# Patient Record
Sex: Female | Born: 1990 | Race: White | Hispanic: No | Marital: Single | Smoking: Never smoker
Health system: Southern US, Community
[De-identification: ages and names within clinical notes are randomized; demographics above are authoritative.]

## PROBLEM LIST (undated history)

## (undated) DIAGNOSIS — R000 Tachycardia, unspecified: Secondary | ICD-10-CM

## (undated) DIAGNOSIS — F410 Panic disorder [episodic paroxysmal anxiety] without agoraphobia: Secondary | ICD-10-CM

## (undated) DIAGNOSIS — R42 Dizziness and giddiness: Secondary | ICD-10-CM

## (undated) DIAGNOSIS — M1990 Unspecified osteoarthritis, unspecified site: Secondary | ICD-10-CM

## (undated) DIAGNOSIS — J45909 Unspecified asthma, uncomplicated: Secondary | ICD-10-CM

## (undated) DIAGNOSIS — F419 Anxiety disorder, unspecified: Secondary | ICD-10-CM

## (undated) DIAGNOSIS — M069 Rheumatoid arthritis, unspecified: Secondary | ICD-10-CM

## (undated) DIAGNOSIS — L509 Urticaria, unspecified: Secondary | ICD-10-CM

## (undated) HISTORY — DX: Urticaria, unspecified: L50.9

## (undated) HISTORY — DX: Unspecified asthma, uncomplicated: J45.909

## (undated) HISTORY — DX: Panic disorder (episodic paroxysmal anxiety): F41.0

## (undated) HISTORY — PX: NO PAST SURGERIES: SHX2092

## (undated) HISTORY — DX: Anxiety disorder, unspecified: F41.9

## (undated) HISTORY — DX: Rheumatoid arthritis, unspecified: M06.9

## (undated) HISTORY — DX: Tachycardia, unspecified: R00.0

## (undated) HISTORY — DX: Dizziness and giddiness: R42

## (undated) HISTORY — DX: Unspecified osteoarthritis, unspecified site: M19.90

---

## 2013-07-16 ENCOUNTER — Ambulatory Visit: Payer: BC Managed Care – PPO | Admitting: Physician Assistant

## 2013-07-16 VITALS — BP 120/70 | HR 109 | Temp 99.1°F | Resp 16 | Ht 62.5 in | Wt 184.6 lb

## 2013-07-16 DIAGNOSIS — J029 Acute pharyngitis, unspecified: Secondary | ICD-10-CM

## 2013-07-16 DIAGNOSIS — J4520 Mild intermittent asthma, uncomplicated: Secondary | ICD-10-CM | POA: Insufficient documentation

## 2013-07-16 LAB — POCT CBC
Granulocyte percent: 68.3 %G (ref 37–80)
HEMATOCRIT: 40.1 % (ref 37.7–47.9)
HEMOGLOBIN: 12.6 g/dL (ref 12.2–16.2)
LYMPH, POC: 2 (ref 0.6–3.4)
MCH: 28.4 pg (ref 27–31.2)
MCHC: 31.4 g/dL — AB (ref 31.8–35.4)
MCV: 90.4 fL (ref 80–97)
MID (cbc): 0.7 (ref 0–0.9)
MPV: 9 fL (ref 0–99.8)
POC GRANULOCYTE: 5.9 (ref 2–6.9)
POC LYMPH %: 23.3 % (ref 10–50)
POC MID %: 8.4 %M (ref 0–12)
Platelet Count, POC: 331 10*3/uL (ref 142–424)
RBC: 4.44 M/uL (ref 4.04–5.48)
RDW, POC: 13.3 %
WBC: 8.7 10*3/uL (ref 4.6–10.2)

## 2013-07-16 LAB — POCT RAPID STREP A (OFFICE): RAPID STREP A SCREEN: NEGATIVE
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
## 2013-07-16 NOTE — Patient Instructions (Signed)
Tylenol and motrin for the pain. Mucinex for the congestion and post-nasal drip. Drink fluids to help thin out the mucus.

## 2013-07-19 LAB — CULTURE, GROUP A STREP: Organism ID, Bacteria: NORMAL

## 2013-10-07 ENCOUNTER — Ambulatory Visit (INDEPENDENT_AMBULATORY_CARE_PROVIDER_SITE_OTHER): Payer: BC Managed Care – PPO | Admitting: Physician Assistant

## 2013-10-07 VITALS — BP 112/74 | HR 83 | Temp 97.8°F | Resp 18 | Ht 62.0 in | Wt 182.2 lb

## 2013-10-07 DIAGNOSIS — J45909 Unspecified asthma, uncomplicated: Secondary | ICD-10-CM

## 2013-10-07 MED ORDER — ALBUTEROL SULFATE HFA 108 (90 BASE) MCG/ACT IN AERS: 1.0000 | INHALATION_SPRAY | Freq: Four times a day (QID) | RESPIRATORY_TRACT | Status: DC | PRN

## 2013-10-07 NOTE — Progress Notes (Signed)
   Subjective:    Patient ID: Terri Ross, female    DOB: 1991/01/20, 23 y.o.   MRN: [id number]  HPI 23 year old female presents for refills on her albuterol inhaler.  She uses this intermittently for allergy and exercise induced asthma.  Does not have any daily inhaler that she uses - feels controlled with current therapy.  States she typically uses 1 puff every 2-3 days as needed for SOB.  Does not have episodes of wheezing or chest pain.  Takes OTC allergy medication during spring/fall.      Review of Systems  Respiratory: Negative for cough, shortness of breath and wheezing.   Neurological: Negative for dizziness and headaches.       Objective:   Physical Exam  Constitutional: She is oriented to person, place, and time. She appears well-developed and well-nourished.  HENT:  Head: Normocephalic and atraumatic.  Right Ear: External ear normal.  Left Ear: External ear normal.  Eyes: Conjunctivae are normal.  Neck: Normal range of motion.  Cardiovascular: Normal rate, regular rhythm and normal heart sounds.   Pulmonary/Chest: Effort normal and breath sounds normal.  Neurological: She is alert and oriented to person, place, and time.  Psychiatric: She has a normal mood and affect. Her behavior is normal. Judgment and thought content normal.          Assessment & Plan:  Unspecified asthma(493.90) - Plan: albuterol (PROVENTIL HFA;VENTOLIN HFA) 108 (90 BASE) MCG/ACT inhaler  Refilled albuterol inhaler q4-6hours prn wheezing, SOB, coughing Continue allergy medication as directed RTC precautions discussed. If using albuterol more frequently may need to consider daily inhaler

## 2014-08-15 ENCOUNTER — Ambulatory Visit: Payer: 59

## 2014-08-16 ENCOUNTER — Emergency Department (HOSPITAL_COMMUNITY)
Admission: EM | Admit: 2014-08-16 | Discharge: 2014-08-16 | Disposition: A | Discharge status: Home / Self Care | Payer: 59 | Attending: Emergency Medicine | Admitting: Emergency Medicine

## 2014-08-16 ENCOUNTER — Emergency Department (HOSPITAL_COMMUNITY): Payer: 59

## 2014-08-16 ENCOUNTER — Encounter (HOSPITAL_COMMUNITY): Payer: Self-pay | Admitting: Emergency Medicine

## 2014-08-16 DIAGNOSIS — S299XXA Unspecified injury of thorax, initial encounter: Secondary | ICD-10-CM | POA: Insufficient documentation

## 2014-08-16 DIAGNOSIS — Y9389 Activity, other specified: Secondary | ICD-10-CM | POA: Diagnosis not present

## 2014-08-16 DIAGNOSIS — S3991XA Unspecified injury of abdomen, initial encounter: Secondary | ICD-10-CM | POA: Diagnosis not present

## 2014-08-16 DIAGNOSIS — Z8739 Personal history of other diseases of the musculoskeletal system and connective tissue: Secondary | ICD-10-CM | POA: Insufficient documentation

## 2014-08-16 DIAGNOSIS — Z79899 Other long term (current) drug therapy: Secondary | ICD-10-CM | POA: Insufficient documentation

## 2014-08-16 DIAGNOSIS — S7011XA Contusion of right thigh, initial encounter: Secondary | ICD-10-CM | POA: Diagnosis not present

## 2014-08-16 DIAGNOSIS — S29092A Other injury of muscle and tendon of back wall of thorax, initial encounter: Secondary | ICD-10-CM | POA: Diagnosis not present

## 2014-08-16 DIAGNOSIS — Y998 Other external cause status: Secondary | ICD-10-CM | POA: Insufficient documentation

## 2014-08-16 DIAGNOSIS — Y92410 Unspecified street and highway as the place of occurrence of the external cause: Secondary | ICD-10-CM | POA: Insufficient documentation

## 2014-08-16 DIAGNOSIS — J45909 Unspecified asthma, uncomplicated: Secondary | ICD-10-CM | POA: Diagnosis not present

## 2014-08-16 DIAGNOSIS — S3992XA Unspecified injury of lower back, initial encounter: Secondary | ICD-10-CM | POA: Insufficient documentation

## 2014-08-16 DIAGNOSIS — M542 Cervicalgia: Secondary | ICD-10-CM

## 2014-08-16 DIAGNOSIS — S199XXA Unspecified injury of neck, initial encounter: Secondary | ICD-10-CM | POA: Diagnosis not present

## 2014-08-16 DIAGNOSIS — M545 Low back pain: Secondary | ICD-10-CM

## 2014-08-16 DIAGNOSIS — M791 Myalgia, unspecified site: Secondary | ICD-10-CM

## 2014-08-16 DIAGNOSIS — T148XXA Other injury of unspecified body region, initial encounter: Secondary | ICD-10-CM

## 2014-08-16 DIAGNOSIS — S4991XA Unspecified injury of right shoulder and upper arm, initial encounter: Secondary | ICD-10-CM | POA: Diagnosis not present

## 2014-08-16 LAB — COMPREHENSIVE METABOLIC PANEL
ALBUMIN: 4.6 g/dL (ref 3.5–5.2)
ALT: 24 U/L (ref 0–35)
ANION GAP: 9 (ref 5–15)
AST: 18 U/L (ref 0–37)
Alkaline Phosphatase: 80 U/L (ref 39–117)
BUN: 9 mg/dL (ref 6–23)
CO2: 27 mmol/L (ref 19–32)
CREATININE: 0.73 mg/dL (ref 0.50–1.10)
Calcium: 9.5 mg/dL (ref 8.4–10.5)
Chloride: 102 mmol/L (ref 96–112)
GFR calc non Af Amer: >90 mL/min (ref >90)
Glucose, Bld: 89 mg/dL (ref 70–99)
Potassium: 3.5 mmol/L (ref 3.5–5.1)
SODIUM: 138 mmol/L (ref 135–145)
TOTAL PROTEIN: 8.7 g/dL — AB (ref 6.0–8.3)
Total Bilirubin: 0.5 mg/dL (ref 0.3–1.2)

## 2014-08-16 LAB — CBC WITH DIFFERENTIAL/PLATELET
Basophils Absolute: 0 10*3/uL (ref 0.0–0.1)
Basophils Relative: 0 % (ref 0–1)
Eosinophils Absolute: 0.3 10*3/uL (ref 0.0–0.7)
Eosinophils Relative: 2 % (ref 0–5)
HEMATOCRIT: 46.1 % — AB (ref 36.0–46.0)
Hemoglobin: 15.3 g/dL — ABNORMAL HIGH (ref 12.0–15.0)
Lymphocytes Relative: 20 % (ref 12–46)
Lymphs Abs: 2.7 10*3/uL (ref 0.7–4.0)
MCH: 29.3 pg (ref 26.0–34.0)
MCHC: 33.2 g/dL (ref 30.0–36.0)
MCV: 88.3 fL (ref 78.0–100.0)
MONO ABS: 0.7 10*3/uL (ref 0.1–1.0)
MONOS PCT: 5 % (ref 3–12)
NEUTROS ABS: 9.6 10*3/uL — AB (ref 1.7–7.7)
Neutrophils Relative %: 73 % (ref 43–77)
PLATELETS: 377 10*3/uL (ref 150–400)
RBC: 5.22 MIL/uL — ABNORMAL HIGH (ref 3.87–5.11)
RDW: 12.3 % (ref 11.5–15.5)
WBC: 13.3 10*3/uL — AB (ref 4.0–10.5)

## 2014-08-16 LAB — POC URINE PREG, ED: PREG TEST UR: NEGATIVE

## 2014-08-16 MED ORDER — IOHEXOL 300 MG/ML  SOLN
100.0000 mL | Freq: Once | INTRAMUSCULAR | Status: AC | PRN
  Administered 2014-08-16: 100 mL via INTRAVENOUS

## 2014-08-16 MED ORDER — CYCLOBENZAPRINE HCL 10 MG PO TABS: 5.0000 mg | ORAL_TABLET | Freq: Every day | ORAL | Status: DC
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
## 2014-08-16 NOTE — ED Notes (Addendum)
Pt using profanity on arrival to Pt room for placement of IV for CT scan. PA informed of PT response.

## 2014-08-16 NOTE — Discharge Instructions (Signed)
Please call your doctor for a followup appointment within 24-48 hours. When you talk to your doctor please let them know that you were seen in the emergency department and have them acquire all of your records so that they can discuss the findings with you and formulate a treatment plan to fully care for your new and ongoing problems. Please call and set up an appointment with health and wellness Center to be seen and reassessed Please follow-up with orthopedics Please rest and stay hydrated Please apply warm compressions and massage with icy hot ointment for muscular relief Please take medications as prescribed. While a muscle relaxers his be no drinking alcohol, driving, operating any machinery. Muscle relaxers can cause drowsiness.  Please continue to monitor symptoms closely and if symptoms are to worsen or change (fever greater than 101, chills, sweating, nausea, vomiting, chest pain, shortness of breathe, difficulty breathing, weakness, numbness, tingling, worsening or changes to pain pattern, fall, injury, headache, dizziness, loss of sensation, visual changes, neck pain, neck stiffness, it is swallow, inability to food or fluids down, inability to control urine or bowel movements) please report back to the Emergency Department immediately.    Abrasion An abrasion is a cut or scrape of the skin. Abrasions do not extend through all layers of the skin and most heal within 10 days. It is important to care for your abrasion properly to prevent infection. CAUSES  Most abrasions are caused by falling on, or gliding across, the ground or other surface. When your skin rubs on something, the outer and inner layer of skin rubs off, causing an abrasion. DIAGNOSIS  Your caregiver will be able to diagnose an abrasion during a physical exam.  TREATMENT  Your treatment depends on how large and deep the abrasion is. Generally, your abrasion will be cleaned with water and a mild soap to remove any dirt or  debris. An antibiotic ointment may be put over the abrasion to prevent an infection. A bandage (dressing) may be wrapped around the abrasion to keep it from getting dirty.  You may need a tetanus shot if:  You cannot remember when you had your last tetanus shot.  You have never had a tetanus shot.  The injury broke your skin. If you get a tetanus shot, your arm may swell, get red, and feel warm to the touch. This is common and not a problem. If you need a tetanus shot and you choose not to have one, there is a rare chance of getting tetanus. Sickness from tetanus can be serious.  HOME CARE INSTRUCTIONS   If a dressing was applied, change it at least once a day or as directed by your caregiver. If the bandage sticks, soak it off with warm water.   Wash the area with water and a mild soap to remove all the ointment 2 times a day. Rinse off the soap and pat the area dry with a clean towel.   Reapply any ointment as directed by your caregiver. This will help prevent infection and keep the bandage from sticking. Use gauze over the wound and under the dressing to help keep the bandage from sticking.   Change your dressing right away if it becomes wet or dirty.   Only take over-the-counter or prescription medicines for pain, discomfort, or fever as directed by your caregiver.   Follow up with your caregiver within 24-48 hours for a wound check, or as directed. If you were not given a wound-check appointment, look closely at your  abrasion for redness, swelling, or pus. These are signs of infection. SEEK IMMEDIATE MEDICAL CARE IF:   You have increasing pain in the wound.   You have redness, swelling, or tenderness around the wound.   You have pus coming from the wound.   You have a fever or persistent symptoms for more than 2-3 days.  You have a fever and your symptoms suddenly get worse.  You have a bad smell coming from the wound or dressing.  MAKE SURE YOU:   Understand these  instructions.  Will watch your condition.  Will get help right away if you are not doing well or get worse. Document Released: 02/01/2005 Document Revised: 04/10/2012 Document Reviewed: 03/28/2011 [facility] Patient Information 2015 [place], [place]. This information is not intended to replace advice given to you by your health care provider. Make sure you discuss any questions you have with your health care provider. Muscle Pain Muscle pain (myalgia) may be caused by many things, including:  Overuse or muscle strain, especially if you are not in shape. This is the most common cause of muscle pain.  Injury.  Bruises.  Viruses, such as the flu.  Infectious diseases.  Fibromyalgia, which is a chronic condition that causes muscle tenderness, fatigue, and headache.  Autoimmune diseases, including lupus.  Certain drugs, including ACE inhibitors and statins. Muscle pain may be mild or severe. In most cases, the pain lasts only a short time and goes away without treatment. To diagnose the cause of your muscle pain, your health care provider will take your medical history. This means he or she will ask you when your muscle pain began and what has been happening. If you have not had muscle pain for very long, your health care provider may want to wait before doing much testing. If your muscle pain has lasted a long time, your health care provider may want to run tests right away. If your health care provider thinks your muscle pain may be caused by illness, you may need to have additional tests to rule out certain conditions.  Treatment for muscle pain depends on the cause. Home care is often enough to relieve muscle pain. Your health care provider may also prescribe anti-inflammatory medicine. HOME CARE INSTRUCTIONS Watch your condition for any changes. The following actions may help to lessen any discomfort you are feeling:  Only take over-the-counter or prescription medicines as directed by your  health care provider.  Apply ice to the sore muscle:  Put ice in a plastic bag.  Place a towel between your skin and the bag.  Leave the ice on for 15-20 minutes, 3-4 times a day.  You may alternate applying hot and cold packs to the muscle as directed by your health care provider.  If overuse is causing your muscle pain, slow down your activities until the pain goes away.  Remember that it is normal to feel some muscle pain after starting a workout program. Muscles that have not been used often will be sore at first.  Do regular, gentle exercises if you are not usually active.  Warm up before exercising to lower your risk of muscle pain.  Do not continue working out if the pain is very bad. Bad pain could mean you have injured a muscle. SEEK MEDICAL CARE IF:  Your muscle pain gets worse, and medicines do not help.  You have muscle pain that lasts longer than 3 days.  You have a rash or fever along with muscle pain.  You  have muscle pain after a tick bite.  You have muscle pain while working out, even though you are in good physical condition.  You have redness, soreness, or swelling along with muscle pain.  You have muscle pain after starting a new medicine or changing the dose of a medicine. SEEK IMMEDIATE MEDICAL CARE IF:  You have trouble breathing.  You have trouble swallowing.  You have muscle pain along with a stiff neck, fever, and vomiting.  You have severe muscle weakness or cannot move part of your body. MAKE SURE YOU:   Understand these instructions.  Will watch your condition.  Will get help right away if you are not doing well or get worse. Document Released: 03/16/2006 Document Revised: 04/29/2013 Document Reviewed: 02/18/2013 [facility] Patient Information 2015 [place], [place]. This information is not intended to replace advice given to you by your health care provider. Make sure you discuss any questions you have with your health care  provider.   Emergency Department Resource Guide 1) Find a Doctor and Pay Out of Pocket Although you won't have to find out who is covered by your insurance plan, it is a good idea to ask around and get recommendations. You will then need to call the office and see if the doctor you have chosen will accept you as a new patient and what types of options they offer for patients who are self-pay. Some doctors offer discounts or will set up payment plans for their patients who do not have insurance, but you will need to ask so you aren't surprised when you get to your appointment.  2) Contact Your Local Health Department Not all health departments have doctors that can see patients for sick visits, but many do, so it is worth a call to see if yours does. If you don't know where your local health department is, you can check in your phone book. The CDC also has a tool to help you locate your state's health department, and many state websites also have listings of all of their local health departments.  3) Find a Walk-in Clinic If your illness is not likely to be very severe or complicated, you may want to try a walk in clinic. These are popping up all over the country in pharmacies, drugstores, and shopping centers. They're usually staffed by nurse practitioners or physician assistants that have been trained to treat common illnesses and complaints. They're usually fairly quick and inexpensive. However, if you have serious medical issues or chronic medical problems, these are probably not your best option.  No Primary Care Doctor: - Call Health Connect at  [phone] - they can help you locate a primary care doctor that  accepts your insurance, provides certain services, etc. - Physician Referral Service- [phone]  Chronic Pain Problems: Organization         Address  Phone   Notes  [facility]  [phone] Patients need to be referred by their primary care doctor.    Medication Assistance: Organization         Address  Phone   Notes  [facility] Medication [facility] [address] [place] [place], [place] [address] [phone] --Must be a resident of [facility] -- Must have NO insurance coverage whatsoever (no Medicaid/ Medicare, etc.) -- The pt. MUST have a primary care doctor that directs their care regularly and follows them in the community   [facility]  [phone]   [address]  [phone]  Agencies that provide inexpensive medical care: Organization         Address  Phone   Notes  [facility]  [phone]   [facility]    [phone]   [facility] [address] [place], [place] [address] [phone]   [facility] [address], [place] [phone]   Planned Parenthood    [phone]   [facility]    [phone]   [facility] and [facility]  [address], [place] Phone:  [phone], Fax:  [phone] Hours of Operation:  9 am - 6 pm, M-F.  Also accepts Medicaid/Medicare and self-pay.  [facility]  [address], [place] Phone: [phone], Fax: [phone]. Hours of Operation:  8:30 am - 5:30 pm, M-F.  Also accepts Medicaid and self-pay.  [facility] [place] [address] [place] Phone: [phone]   [facility] [address] Natasha Bence [place], [place] [phone], Ext. [phone] Mondays & Thursdays: 7-9 AM.  First 15 patients are seen on a first come, first serve basis.    Medicaid-accepting [facility] Providers:  Organization         Address  Phone   Notes  [facility] [address], [place] [phone] Also accepts self-pay patients.  [facility] [address] Laurell Josephs [place], [place]  [phone]   [facility] [address], [place] [phone]   [facility] [address] [place] [phone]   Renaye Rakers [address], [place]   [phone] Only accepts [place] Access [place] patients after they have their name applied to their card.   Self-Pay (no insurance) in [facility]:  Organization         Address  Phone   Notes  Sickle Cell Patients, [facility] [address] [place], [place] [phone]   [facility] [address] [place], [place] [phone]   [facility] [place]  [address], [place] [phone]   [facility]/Dr. Osei-Bonsu  [address] [place] or [address], [place] [phone] Phone number for both [place] and [place] locations is the same.  [facility] [address] [place] [phone]   [facility] [address] [place] or [address] [phone]   [facility] [address] [place] [phone], phone; [phone], fax Sees patients 1st and 3rd Saturday of every month.  Must not qualify for public or private insurance (i.e. Medicaid, Medicare, [place] Health Choice, Veterans' Benefits)  Household income should be no more than 200% of the poverty level The clinic cannot treat you if you are pregnant or think you are pregnant  Sexually transmitted diseases are not treated at the clinic.    Dental Care: Organization         Address  Phone  Notes  [facility] Department of [facility] [address] [place], [place] [phone] Accepts children up to age 84 who are enrolled in [place] or [place] Health Choice; pregnant women with a Medicaid card; and children who have applied for Medicaid or [place]  Health Choice, but were declined, whose parents can pay a reduced fee at time of service.   [facility] Department of [facility]  [address], [place] [phone] Accepts children up to age 64 who are enrolled in [place] or [place] Health Choice; pregnant women with a Medicaid card; and children who have applied for Medicaid or [place] Health Choice, but were declined, whose parents can pay a reduced fee at time of service.  [facility]  [address] [place], [place] [phone] Patients are seen by appointment only. Walk-ins are not accepted. [facility] will see patients 62 years of age and older. Monday - Tuesday (8am-5pm) Most Wednesdays (8:30-5pm) $30 per visit, cash only  [facility] Adult Dental Access PRO[facility]  [address], [facility] [phone] Patients are seen by appointment only. Walk-ins are not accepted. [facility] will see patients 63 years of age and older. One Wednesday Evening (Monthly: Volunteer Based).  $30 per visit, cash only  [employer]  [phone] for adults; Children under age 69, call Graduate Pediatric Dentistry at [phone]. Children aged 64-14, please call [phone] to request a pediatric application.  Dental services are provided in all areas of dental care including fillings, crowns and bridges, complete and partial dentures, implants, gum treatment, root canals, and extractions. Preventive care is also provided. Treatment is provided to both adults and children. Patients are selected via a lottery and there is often a waiting list.   [facility] [address], [place]  [phone] [email or web address]   [facility] [address] [place], [place] [phone], Ext. [phone] Second and Fourth Thursday of each month, opens at 6:30 AM; Clinic ends at 9 AM.  Patients are seen on a first-come first-served basis, and a limited number are seen during each clinic.   [facility]  [address] Ether Griffins [place], [place] [phone]   Eligibility Requirements You must have lived in [place], [place], or [place] counties for at least the last three months.   You cannot be eligible for state or federal sponsored [place], including [employer], [place], or [employer].   You generally cannot be eligible for healthcare insurance through your employer.    How to apply: Eligibility screenings are held every Tuesday and Wednesday afternoon from 1:00 pm until 4:00 pm. You do not need an appointment for the interview!  [facility] [address], [place], [place] [phone]   [facility] Health Department  [phone]   [facility] Health Department  [phone]   [facility] Health Department  [phone]    Behavioral Health Resources in the Community: Intensive Outpatient Programs Organization         Address  Phone  Notes  [facility] [address] [place], [place] [phone]   [facility] Outpatient [address], [place], [place] [phone]   [facility] [address], [place], [place]  [phone]   [facility] Mental Health [address],  [place], [place] [phone] or [phone]   Substance Abuse Resources Organization         Address  Phone  Notes  [facility] Drug [facility]  [phone]   [facility]  [phone]   [facility] [place]  [phone]   Floydene Flock  [phone]   Residential & Outpatient Substance Abuse Program  [phone]  Psychological Services Organization         Address  Phone  Notes  [facility]al Health  [phone]   [facility] Services  [phone]   [facility] Mental Health [phone] [address], [place] [phone] or [phone]    Mobile Crisis Teams Organization         Address  Phone  Notes  [facility], Mobile Crisis Care Unit  [phone]   [facility]  [address] [place], [place] [phone]   Doristine Locks [address] [place] [phone]    Self-Help/Support Groups Organization         Address  Phone             Notes  [facility] [place] - variety of support groups  336- I7437963 Call for more information  Narcotics Anonymous (NA), [facility] [address], [employer] [place]  2 meetings at this location   Statistician         Address  Phone  Notes  [facility] [address],    [place]  [phone]   [facility]  [address], [place] [address], [place], [place] [phone]   [facility] Treatment Facility [address] [place], [place] [phone] Admissions: 8am-3pm M-F  [facility] [address],    [place], [place] [phone]   [facility] [address] [place], [place], [place] [phone]   [facility] [address],  [place], [place] [phone]   [facility] - Intensive Outpatient [address] Laurell Josephs [address], [place], [place] [phone]   [facility] ([facility] [address] [place]  [place], [place] [phone] or [phone]   [facility] ([facility]) [address] [place], [place] [phone] Accepts Medicaid  [facility] [address]  [place] [phone] Substance Abuse/Addiction Treatment   [facility] Organization         Address  Phone  Notes  [facility]  [phone]   Angie Fava, PhD [address] Ervin Knack [place], [place]   [phone] or [phone]   [facility] Behavioral   [address] [place], [place] [phone]   [facility] [address] [place], [place] [phone] Insurance/Medicaid/sponsorship through [facility] [address]                                    [place], [place] [phone]  Therapy/tele-psych/case  [facility] [address], [place] [phone]    Dr. Lolly Mustache  [phone]   [facility]  [employer] [facility] Dept. 1) [address], [place] 2) [address], [place] 3)  [address], [place] [phone]  [phone]   [facility] Child Abuse Hotline (772)169-9250 or [phone] (After Hours)

## 2014-08-16 NOTE — ED Notes (Signed)
PA at PT bed  To explain the need for IV placement.

## 2014-09-07 ENCOUNTER — Other Ambulatory Visit: Payer: Self-pay | Admitting: *Deleted

## 2014-09-07 DIAGNOSIS — J45909 Unspecified asthma, uncomplicated: Secondary | ICD-10-CM

## 2014-09-07 MED ORDER — ALBUTEROL SULFATE HFA 108 (90 BASE) MCG/ACT IN AERS: 1.0000 | INHALATION_SPRAY | Freq: Four times a day (QID) | RESPIRATORY_TRACT | Status: DC | PRN

## 2015-08-18 DIAGNOSIS — F3113 Bipolar disorder, current episode manic without psychotic features, severe: Secondary | ICD-10-CM | POA: Diagnosis not present

## 2015-08-20 DIAGNOSIS — F411 Generalized anxiety disorder: Secondary | ICD-10-CM | POA: Diagnosis not present

## 2015-09-08 DIAGNOSIS — F3112 Bipolar disorder, current episode manic without psychotic features, moderate: Secondary | ICD-10-CM | POA: Diagnosis not present

## 2015-09-10 DIAGNOSIS — F411 Generalized anxiety disorder: Secondary | ICD-10-CM | POA: Diagnosis not present

## 2015-09-10 DIAGNOSIS — F329 Major depressive disorder, single episode, unspecified: Secondary | ICD-10-CM | POA: Diagnosis not present

## 2015-09-10 DIAGNOSIS — F331 Major depressive disorder, recurrent, moderate: Secondary | ICD-10-CM | POA: Diagnosis not present

## 2016-02-04 ENCOUNTER — Ambulatory Visit (INDEPENDENT_AMBULATORY_CARE_PROVIDER_SITE_OTHER): Payer: BLUE CROSS/BLUE SHIELD | Admitting: Physician Assistant

## 2016-02-04 VITALS — BP 118/72 | HR 88 | Temp 98.5°F | Resp 17 | Ht 61.5 in | Wt 181.4 lb

## 2016-02-04 DIAGNOSIS — F411 Generalized anxiety disorder: Secondary | ICD-10-CM | POA: Insufficient documentation

## 2016-02-04 DIAGNOSIS — F32A Depression, unspecified: Secondary | ICD-10-CM | POA: Insufficient documentation

## 2016-02-04 DIAGNOSIS — H66001 Acute suppurative otitis media without spontaneous rupture of ear drum, right ear: Secondary | ICD-10-CM

## 2016-02-04 DIAGNOSIS — F329 Major depressive disorder, single episode, unspecified: Secondary | ICD-10-CM | POA: Insufficient documentation

## 2016-02-04 MED ORDER — AMOXICILLIN 875 MG PO TABS: 875.0000 mg | ORAL_TABLET | Freq: Two times a day (BID) | ORAL | 0 refills | Status: DC

## 2016-02-04 NOTE — Progress Notes (Signed)
   Terri Ross  MRN: [id number] DOB: 08-22-1990  Subjective:  Pt presents to clinic with cold symptoms for the last 2 days.  It started with pain in the right ear.  She then started to develop sinus pressures with nasal rhinorrhea with yellow/green color than is mainly causing a PND.  She has horse voice.  She has had some dizziness with head/sinus pressure.  No F/C.  Slight cough 2nd to PND.  She has tried mucinex for a day but did not feel like it helped much.    Review of Systems  Constitutional: Negative for chills.  HENT: Positive for ear pain (right), postnasal drip and rhinorrhea. Negative for sore throat (mild in the am).   Respiratory: Negative for cough, shortness of breath and wheezing.        H/o asthma - she has had to use her inhaler in the middle of the night 2nd to cough 2nd to PND - uses her inhaler and than able to go back to sleep  Neurological: Positive for light-headedness. Negative for dizziness and headaches.    Patient Active Problem List   Diagnosis Date Noted  . Depression 02/04/2016  . GAD (generalized anxiety disorder) 02/04/2016  . Mild intermittent asthma 07/16/2013    Current Outpatient Prescriptions on File Prior to Visit  Medication Sig Dispense Refill  . albuterol (PROVENTIL HFA;VENTOLIN HFA) 108 (90 BASE) MCG/ACT inhaler Inhale 1-2 puffs into the lungs every 6 (six) hours as needed for wheezing or shortness of breath. No more refills without office visit 6.7 g 0   No current facility-administered medications on file prior to visit.     Allergies  Allergen Reactions  . Lamotrigine Rash    Pt patients past, family and social history were reviewed and updated.  Objective:  BP 118/72 (BP Location: Right Arm, Patient Position: Sitting, Cuff Size: Normal)   Pulse 88   Temp 98.5 F (36.9 C) (Oral)   Resp 17   Ht 5' 1.5" (1.562 m)   Wt 181 lb 6.4 oz (82.3 kg)   SpO2 96%   BMI 33.72 kg/m   Physical Exam  Constitutional: She is oriented to  person, place, and time and well-developed, well-nourished, and in no distress.  HENT:  Head: Normocephalic and atraumatic.  Right Ear: Hearing, external ear and ear canal normal. Tympanic membrane is bulging. Tympanic membrane is not injected and not erythematous. A middle ear effusion (purulence fluid) is present.  Left Ear: Hearing, tympanic membrane, external ear and ear canal normal. Tympanic membrane is not injected, not erythematous and not bulging.  Nose: Nose normal.  Mouth/Throat: Uvula is midline, oropharynx is clear and moist and mucous membranes are normal.  Eyes: Conjunctivae are normal.  Neck: Normal range of motion.  Cardiovascular: Normal rate, regular rhythm and normal heart sounds.   No murmur heard. Pulmonary/Chest: Effort normal and breath sounds normal.  Neurological: She is alert and oriented to person, place, and time. Gait normal.  Skin: Skin is warm and dry.  Psychiatric: Mood, memory, affect and judgment normal.  Vitals reviewed.   Assessment and Plan :  Acute suppurative otitis media of right ear without spontaneous rupture of tympanic membrane, recurrence not specified - Plan: amoxicillin (AMOXIL) 875 MG tablet   Symptomatic treatment d/w patient  Benny Lennert PA-C  [facility] 02/04/2016 11:02 AM

## 2016-02-04 NOTE — Patient Instructions (Addendum)
Otitis Media, Adult Otitis media is redness, soreness, and inflammation of the middle ear. Otitis media may be caused by allergies or, most commonly, by infection. Often it occurs as a complication of the common cold. SIGNS AND SYMPTOMS Symptoms of otitis media may include:  Earache.  Fever.  Ringing in your ear.  Headache.  Leakage of fluid from the ear. DIAGNOSIS To diagnose otitis media, your health care provider will examine your ear with an otoscope. This is an instrument that allows your health care provider to see into your ear in order to examine your eardrum. Your health care provider also will ask you questions about your symptoms. TREATMENT  Typically, otitis media resolves on its own within 3-5 days. Your health care provider may prescribe medicine to ease your symptoms of pain. If otitis media does not resolve within 5 days or is recurrent, your health care provider may prescribe antibiotic medicines if he or she suspects that a bacterial infection is the cause. HOME CARE INSTRUCTIONS   If you were prescribed an antibiotic medicine, finish it all even if you start to feel better.  Take medicines only as directed by your health care provider.  Keep all follow-up visits as directed by your health care provider. SEEK MEDICAL CARE IF:  You have otitis media only in one ear, or bleeding from your nose, or both.  You notice a lump on your neck.  You are not getting better in 3-5 days.  You feel worse instead of better. SEEK IMMEDIATE MEDICAL CARE IF:   You have pain that is not controlled with medicine.  You have swelling, redness, or pain around your ear or stiffness in your neck.  You notice that part of your face is paralyzed.  You notice that the bone behind your ear (mastoid) is tender when you touch it. MAKE SURE YOU:   Understand these instructions.  Will watch your condition.  Will get help right away if you are not doing well or get worse.   This  information is not intended to replace advice given to you by your health care provider. Make sure you discuss any questions you have with your health care provider.   Document Released: 01/28/2004 Document Revised: 05/15/2014 Document Reviewed: 11/19/2012 Elsevier Interactive Patient Education [employer]     IF you received an x-ray today, you will receive an invoice from [facility]. Please contact [facility] at [phone] with questions or concerns regarding your invoice.   IF you received labwork today, you will receive an invoice from [employer]. Please contact [facility] at [phone] with questions or concerns regarding your invoice.   Our billing staff will not be able to assist you with questions regarding bills from these companies.  You will be contacted with the lab results as soon as they are available. The fastest way to get your results is to activate your My Chart account. Instructions are located on the last page of this paperwork. If you have not heard from [place] regarding the results in 2 weeks, please contact this office.

## 2016-04-05 ENCOUNTER — Ambulatory Visit (INDEPENDENT_AMBULATORY_CARE_PROVIDER_SITE_OTHER): Payer: BLUE CROSS/BLUE SHIELD | Admitting: Emergency Medicine

## 2016-04-05 VITALS — BP 130/82 | HR 102 | Temp 97.7°F

## 2016-04-05 DIAGNOSIS — J019 Acute sinusitis, unspecified: Secondary | ICD-10-CM | POA: Diagnosis not present

## 2016-04-05 DIAGNOSIS — J4531 Mild persistent asthma with (acute) exacerbation: Secondary | ICD-10-CM | POA: Diagnosis not present

## 2016-04-05 MED ORDER — PREDNISONE 20 MG PO TABS: 40.0000 mg | ORAL_TABLET | Freq: Every day | ORAL | 0 refills | Status: DC

## 2016-04-05 MED ORDER — AMOXICILLIN 875 MG PO TABS: 875.0000 mg | ORAL_TABLET | Freq: Two times a day (BID) | ORAL | 0 refills | Status: DC

## 2016-04-05 MED ORDER — ALBUTEROL SULFATE HFA 108 (90 BASE) MCG/ACT IN AERS: 2.0000 | INHALATION_SPRAY | Freq: Four times a day (QID) | RESPIRATORY_TRACT | 0 refills | Status: DC | PRN

## 2016-04-05 NOTE — Progress Notes (Signed)
Subjective: Terri Ross, wheeze KKX:FGHWE Key is a 25 y.o. female presenting to clinic today for same day appointment. PCP: No PCP Per Patient Concerns today include:  URI Patient reports that she started having sinus congestion with intermittent headache.  Endorses post nasal drip.  Reports occ dry cough.  Endorses sneezing and sore throat.  Denies fevers, nausea, vomiting, diarrhea.  Symptoms started on 11/19.  She reports that symptoms wax and wane.  She notes that things seemed to get better then got worse again.  She notes she rides a scooter for transportation and is often exposed to the elements.  Roommate had a sinus infection a few weeks ago.  Patient has a history of asthma.  She reports intermittent rattling in her chest.  She notes that she is having SOB about every 4 hours w/ wheeze.  She notes that she used her Albuterol around the clock on 11/19.  Over the last few days she has been using 1 puff QID.  Not on a controller medication.  Uses Chlorphen once daily for allergy control.  She has not used any other OTC meds for her symptoms.  She last used her Albuterol inhaler about 1 hour ago.  Social History Reviewed: non smoker. FamHx and MedHx reviewed.  Please see EMR. Health Maintenance: did not get flu shot and does not desire one.  ROS: Per HPI  Objective: Office vital signs reviewed. BP 130/82 (BP Location: Right Arm, Patient Position: Sitting, Cuff Size: Small)   Pulse (!) 102   Temp 97.7 F (36.5 C) (Oral)   LMP 02/23/2016   SpO2 98%   Physical Examination:  General: Awake, alert, well nourished, No acute distress HEENT: Normal    Neck: No masses palpated. No lymphadenopathy    Ears: Tympanic membranes intact, normal light reflex, no erythema, no bulging    Eyes: PERRLA, EOMI    Nose: nasal turbinates moist, no purulence    Throat: moist mucus membranes, no erythema, no tonsillar exudate or erythema Cardio: regular rate and rhythm, S1S2 heard, no murmurs  appreciated Pulm: clear to auscultation bilaterally, no wheezes, rhonchi or rales, moving good air throughout, normal WOB on room air Extremities: warm, well perfused, No edema, cyanosis or clubbing; +2 pulses bilaterally  Assessment/ Plan: 25 y.o. female   1. Acute non-recurrent sinusitis, unspecified location.  No purulence on exam and no sinus tenderness but given "second sickness", I am concerned about a sinus infection.   - amoxicillin (AMOXIL) 875 MG tablet; Take 1 tablet (875 mg total) by mouth 2 (two) times daily.  Dispense: 20 tablet; Refill: 0 - Return precautions reviewed.  2. Mild persistent asthma with exacerbation.  Normal WOB on room air, moving air well.  Normal O2 saturation.  I suspect the tachycardia is secondary to recent albuterol use.  She appears well hydrated and is afebrile on exam.  I also discussed with patient that she should establish/ see her PCP for an asthma controller medication, as she noted using Albuterol almost daily.  For now, will treat exacerbation. - albuterol (PROVENTIL HFA;VENTOLIN HFA) 108 (90 Base) MCG/ACT inhaler; Inhale 2 puffs into the lungs every 6 (six) hours as needed for wheezing or shortness of breath.  Dispense: 6.7 g; Refill: 0 - predniSONE (DELTASONE) 20 MG tablet; Take 2 tablets (40 mg total) by mouth daily with breakfast.  Dispense: 10 tablet; Refill: 0 - Strict return precautions reviewed  Follow up prn. This patient was discussed with Daub, who agrees with my assessment and plan.  Janora Norlander, DO PGY-3, [facility]

## 2016-04-05 NOTE — Patient Instructions (Addendum)
I have sent in an antibiotic for you to take twice daily for the next 10 days, a steroid to help your breathing to take once daily for 5 days and a refill on your Albuterol inhaler.  I recommend that you use the albuterol inhaler every 4-6 hours while awake for the next 48 hours then only as needed.  I recommend that you follow up with your Primary care doctor in the next week or so to discuss a controller medication for your Asthma, as Albuterol is not recommended for daily use.    Sinusitis, Adult Sinusitis is soreness and inflammation of your sinuses. Sinuses are hollow spaces in the bones around your face. They are located:  Around your eyes.  In the middle of your forehead.  Behind your nose.  In your cheekbones. Your sinuses and nasal passages are lined with a stringy fluid (mucus). Mucus normally drains out of your sinuses. When your nasal tissues get inflamed or swollen, the mucus can get trapped or blocked so air cannot flow through your sinuses. This lets bacteria, viruses, and funguses grow, and that leads to infection. Follow these instructions at home: Medicines  Take, use, or apply over-the-counter and prescription medicines only as told by your doctor. These may include nasal sprays.  If you were prescribed an antibiotic medicine, take it as told by your doctor. Do not stop taking the antibiotic even if you start to feel better. Hydrate and Humidify  Drink enough water to keep your pee (urine) clear or pale yellow.  Use a cool mist humidifier to keep the humidity level in your home above 50%.  Breathe in steam for 10-15 minutes, 3-4 times a day or as told by your doctor. You can do this in the bathroom while a hot shower is running.  Try not to spend time in cool or dry air. Rest  Rest as much as possible.  Sleep with your head raised (elevated).  Make sure to get enough sleep each night. General instructions  Put a warm, moist washcloth on your face 3-4 times a  day or as told by your doctor. This will help with discomfort.  Wash your hands often with soap and water. If there is no soap and water, use hand sanitizer.  Do not smoke. Avoid being around people who are smoking (secondhand smoke).  Keep all follow-up visits as told by your doctor. This is important. Contact a doctor if:  You have a fever.  Your symptoms get worse.  Your symptoms do not get better within 10 days. Get help right away if:  You have a very bad headache.  You cannot stop throwing up (vomiting).  You have pain or swelling around your face or eyes.  You have trouble seeing.  You feel confused.  Your neck is stiff.  You have trouble breathing. This information is not intended to replace advice given to you by your health care provider. Make sure you discuss any questions you have with your health care provider. Document Released: 10/11/2007 Document Revised: 12/19/2015 Document Reviewed: 02/17/2015 Elsevier Interactive Patient Education  2017 Elsevier Inc.   Bronchospasm, Adult A bronchospasm is when the tubes that carry air in and out of your lungs (airways) spasm or tighten. During a bronchospasm it is hard to breathe. This is because the airways get smaller. A bronchospasm can be triggered by:  Allergies. These may be to animals, pollen, food, or mold.  Infection. This is a common cause of bronchospasm.  Exercise.  Irritants. These include pollution, cigarette smoke, strong odors, aerosol sprays, and paint fumes.  Weather changes.  Stress.  Being emotional. Follow these instructions at home:  Always have a plan for getting help. Know when to call your doctor and local emergency services (911 in the [place] Know where you can get emergency care.  Only take medicines as told by your doctor.  If you were prescribed an inhaler or nebulizer machine, ask your doctor how to use it correctly. Always use a spacer with your inhaler if you were given  one.  Stay calm during an attack. Try to relax and breathe more slowly.  Control your home environment:  Change your heating and air conditioning filter at least once a month.  Limit your use of fireplaces and wood stoves.  Do not  smoke. Do not  allow smoking in your home.  Avoid perfumes and fragrances.  Get rid of pests (such as roaches and mice) and their droppings.  Throw away plants if you see mold on them.  Keep your house clean and dust free.  Replace carpet with wood, tile, or vinyl flooring. Carpet can trap dander and dust.  Use allergy-proof pillows, mattress covers, and box spring covers.  Wash bed sheets and blankets every week in hot water. Dry them in a dryer.  Use blankets that are made of polyester or cotton.  Wash hands frequently. Contact a doctor if:  You have muscle aches.  You have chest pain.  The thick spit you spit or cough up (sputum) changes from clear or white to yellow, green, gray, or bloody.  The thick spit you spit or cough up gets thicker.  There are problems that may be related to the medicine you are given such as:  A rash.  Itching.  Swelling.  Trouble breathing. Get help right away if:  You feel you cannot breathe or catch your breath.  You cannot stop coughing.  Your treatment is not helping you breathe better.  You have very bad chest pain. This information is not intended to replace advice given to you by your health care provider. Make sure you discuss any questions you have with your health care provider. Document Released: 02/19/2009 Document Revised: 09/30/2015 Document Reviewed: 10/15/2012 Elsevier Interactive Patient Education  2017 [employer].  IF you received an x-ray today, you will receive an invoice from [facility]. Please contact [facility] at [phone] with questions or concerns regarding your invoice.   IF you received labwork today, you will receive an invoice from [employer]. Please contact [facility] at [phone] with questions or concerns regarding your invoice.   Our billing staff will not be able to assist you with questions regarding bills from these companies.  You will be contacted with the lab results as soon as they are available. The fastest way to get your results is to activate your My Chart account. Instructions are located on the last page of this paperwork. If you have not heard from [place] regarding the results in 2 weeks, please contact this office.

## 2016-06-06 ENCOUNTER — Ambulatory Visit (INDEPENDENT_AMBULATORY_CARE_PROVIDER_SITE_OTHER): Payer: BLUE CROSS/BLUE SHIELD | Admitting: Physician Assistant

## 2016-06-06 ENCOUNTER — Encounter: Payer: Self-pay | Admitting: Physician Assistant

## 2016-06-06 VITALS — BP 132/80 | HR 95 | Temp 97.5°F | Resp 16 | Ht 61.5 in | Wt 193.6 lb

## 2016-06-06 DIAGNOSIS — Z889 Allergy status to unspecified drugs, medicaments and biological substances status: Secondary | ICD-10-CM | POA: Diagnosis not present

## 2016-06-06 DIAGNOSIS — J4531 Mild persistent asthma with (acute) exacerbation: Secondary | ICD-10-CM | POA: Diagnosis not present

## 2016-06-06 MED ORDER — BECLOMETHASONE DIPROPIONATE 40 MCG/ACT IN AERS: 1.0000 | INHALATION_SPRAY | Freq: Two times a day (BID) | RESPIRATORY_TRACT | 5 refills | Status: DC

## 2016-06-06 MED ORDER — ALBUTEROL SULFATE HFA 108 (90 BASE) MCG/ACT IN AERS: 2.0000 | INHALATION_SPRAY | Freq: Four times a day (QID) | RESPIRATORY_TRACT | 5 refills | Status: DC | PRN

## 2016-06-06 MED ORDER — CETIRIZINE HCL 10 MG PO TABS: 10.0000 mg | ORAL_TABLET | Freq: Every day | ORAL | 11 refills | Status: DC

## 2016-06-06 NOTE — Progress Notes (Signed)
[facility] [address] [place] [address] [phone]  Date:  06/06/2016   Name:  Terri Ross   DOB:  07-Jan-1991   MRN:  [id number]  PCP:  No PCP Per Patient    History of Present Illness:  Terri Ross is a 26 y.o. female patient who presents to [facility] for cc of medication refill. Patient uses her albuterol 1-3 times per day.  She would like a refill.  Patient states that she does have episodes of wheezing, but at times uses it when she has anxiety.  The albuterol helps this.  She has no sob or dyspnea at this time.   She states that she has a hx of underactive thyroid.   Currently takes Buspar for anxiety three times per day as needed.  This more so makes her sleepy.  She was taking Celexa which may have helped but she did not return and does not recall if this does helps her use the albuterol less.  "hit my head so many times, your lucky I made it here". She has sinus pressure intermittently since her last visit 2 months ago.  There is facial pressure, and some green mucus.  She uses chlorpheniramine 4 times per day.  No zyrtec use.  She does not tolerate Flonase due to nose bleeds.     Patient Active Problem List   Diagnosis Date Noted  . Depression 02/04/2016  . GAD (generalized anxiety disorder) 02/04/2016  . Mild intermittent asthma 07/16/2013    Past Medical History:  Diagnosis Date  . Arthritis   . Asthma     History reviewed. No pertinent surgical history.  Social History  Substance Use Topics  . Smoking status: Never Smoker  . Smokeless tobacco: Never Used  . Alcohol use No    Family History  Problem Relation Age of Onset  . Cancer Mother   . Hypertension Father   . Asthma Sister   . Arthritis Sister   . Schizophrenia Sister   . Cancer Maternal Grandmother     Allergies  Allergen Reactions  . Lamotrigine Rash    Medication list has been reviewed and updated.  Current Outpatient Prescriptions on File Prior to Visit   Medication Sig Dispense Refill  . busPIRone (BUSPAR) 15 MG tablet Take 15 mg by mouth 3 (three) times daily.    Marland Kitchen amoxicillin (AMOXIL) 875 MG tablet Take 1 tablet (875 mg total) by mouth 2 (two) times daily. (Patient not taking: Reported on 06/06/2016) 20 tablet 0  . predniSONE (DELTASONE) 20 MG tablet Take 2 tablets (40 mg total) by mouth daily with breakfast. (Patient not taking: Reported on 06/06/2016) 10 tablet 0   No current facility-administered medications on file prior to visit.     ROS ROS otherwise unremarkable unless listed above.   Physical Examination: BP 132/80 (BP Location: Right Arm, Patient Position: Sitting, Cuff Size: Small)   Pulse 95   Temp 97.5 F (36.4 C) (Oral)   Resp 16   Ht 5' 1.5" (1.562 m)   Wt 193 lb 9.6 oz (87.8 kg)   LMP 05/16/2016   SpO2 100%   BMI 35.99 kg/m  Ideal Body Weight: Weight in (lb) to have BMI = 25: 134.2  Physical Exam  Constitutional: She is oriented to person, place, and time. She appears well-developed and well-nourished. No distress.  HENT:  Head: Normocephalic and atraumatic.  Right Ear: Tympanic membrane, external ear and ear canal normal.  Left Ear: Tympanic membrane, external  ear and ear canal normal.  Nose: Mucosal edema and rhinorrhea present. Right sinus exhibits no maxillary sinus tenderness and no frontal sinus tenderness. Left sinus exhibits no maxillary sinus tenderness and no frontal sinus tenderness.  Mouth/Throat: No uvula swelling. No oropharyngeal exudate, posterior oropharyngeal edema or posterior oropharyngeal erythema.  Eyes: Conjunctivae and EOM are normal. Pupils are equal, round, and reactive to light.  Cardiovascular: Normal rate and regular rhythm.  Exam reveals no gallop, no distant heart sounds and no friction rub.   No murmur heard. Pulmonary/Chest: Effort normal and breath sounds normal. No respiratory distress. She has no decreased breath sounds. She has no wheezes. She has no rhonchi.  Lymphadenopathy:        Head (right side): No submandibular, no tonsillar, no preauricular and no posterior auricular adenopathy present.       Head (left side): No submandibular, no tonsillar, no preauricular and no posterior auricular adenopathy present.  Neurological: She is alert and oriented to person, place, and time.  Skin: She is not diaphoretic.  Psychiatric: She has a normal mood and affect. Her speech is normal. Her affect is not inappropriate. She is hyperactive (mild). Cognition and memory are normal.     Assessment and Plan: Terri Ross is a 26 y.o. female who is here today for cc of medication refill She states that she would like to get home and would like to postpone any bloodwork to check thyroid, etc.   Refilling the albuterol.  I have advised her to start using qvar, and return in 4-6 weeks.  We can then recheck to see if this was helpful.   There is concern of not knowing hx as she has been placed on ssri, to abilify.  Advised to obtain records.   Multiple allergies - Plan: cetirizine (ZYRTEC) 10 MG tablet  Mild persistent asthma with exacerbation - Plan: albuterol (PROVENTIL HFA;VENTOLIN HFA) 108 (90 Base) MCG/ACT inhaler, beclomethasone (QVAR) 40 MCG/ACT inhaler, cetirizine (ZYRTEC) 10 MG tablet  Trena Platt, PA-C [facility] 1/30/201811:42 AM

## 2016-06-06 NOTE — Patient Instructions (Addendum)
Please do the qvar twice per day as preventative.  I would like you to use the as needed for sob and trouble breathing. Follow up with me in 1 month.      IF you received an x-ray today, you will receive an invoice from [facility]. Please contact [facility] at [phone] with questions or concerns regarding your invoice.   IF you received labwork today, you will receive an invoice from [facility]. Please contact [facility] at [phone] with questions or concerns regarding your invoice.   Our billing staff will not be able to assist you with questions regarding bills from these companies.  You will be contacted with the lab results as soon as they are available. The fastest way to get your results is to activate your My Chart account. Instructions are located on the last page of this paperwork. If you have not heard from [place] regarding the results in 2 weeks, please contact this office.

## 2016-06-13 ENCOUNTER — Telehealth: Payer: Self-pay | Admitting: Family Medicine

## 2016-06-13 NOTE — Telephone Encounter (Signed)
Pt called saying someone called and I didn't see where anybody called her pt also says she dont have voicemail and that she can only be reached between the hours of 9am-2pm

## 2016-06-13 NOTE — Telephone Encounter (Signed)
I see no evidence of a call, did you call her?

## 2016-06-13 NOTE — Telephone Encounter (Signed)
I did not call her.

## 2016-06-15 NOTE — Telephone Encounter (Signed)
The patient was called twice to reschedule her appointment with [place] on 07/11/16 but she did not answer and no voicemail could be left because her mail box was full. I will be sending her a letter to the address on file. And there was a note left on her cancelled appointment.

## 2016-07-11 ENCOUNTER — Ambulatory Visit: Payer: BLUE CROSS/BLUE SHIELD | Admitting: Physician Assistant

## 2016-07-25 DIAGNOSIS — M5020 Other cervical disc displacement, unspecified cervical region: Secondary | ICD-10-CM | POA: Diagnosis not present

## 2016-07-25 DIAGNOSIS — M542 Cervicalgia: Secondary | ICD-10-CM | POA: Diagnosis not present

## 2016-07-25 DIAGNOSIS — M9901 Segmental and somatic dysfunction of cervical region: Secondary | ICD-10-CM | POA: Diagnosis not present

## 2016-07-25 DIAGNOSIS — M9902 Segmental and somatic dysfunction of thoracic region: Secondary | ICD-10-CM | POA: Diagnosis not present

## 2016-07-26 ENCOUNTER — Ambulatory Visit (INDEPENDENT_AMBULATORY_CARE_PROVIDER_SITE_OTHER): Payer: BLUE CROSS/BLUE SHIELD | Admitting: Emergency Medicine

## 2016-07-26 VITALS — BP 118/82 | HR 90 | Temp 98.5°F | Resp 16 | Ht 61.5 in | Wt 186.4 lb

## 2016-07-26 DIAGNOSIS — F411 Generalized anxiety disorder: Secondary | ICD-10-CM

## 2016-07-26 DIAGNOSIS — G44229 Chronic tension-type headache, not intractable: Secondary | ICD-10-CM

## 2016-07-26 DIAGNOSIS — M62838 Other muscle spasm: Secondary | ICD-10-CM | POA: Diagnosis not present

## 2016-07-26 MED ORDER — BUSPIRONE HCL 15 MG PO TABS: 15.0000 mg | ORAL_TABLET | Freq: Three times a day (TID) | ORAL | 2 refills | Status: DC

## 2016-07-26 MED ORDER — DICLOFENAC SODIUM 75 MG PO TBEC: 75.0000 mg | DELAYED_RELEASE_TABLET | Freq: Two times a day (BID) | ORAL | 0 refills | Status: DC

## 2016-07-26 NOTE — Progress Notes (Signed)
Terri Ross 26 y.o.   Chief Complaint  Patient presents with  . Migraine    Pt states migraine lasted 12 days causing upper back & neck pain, saw chiropracter and had a panic attack    HISTORY OF PRESENT ILLNESS: This is a 26 y.o. female with a long H/O GAD c/o diffuse headaches with neck pain made worse with increased levels of anxiety; chronic problem always exacerbated by stress; takes Buspar on and off; out of it now. HPI   Prior to Admission medications   Medication Sig Start Date End Date Taking? Authorizing Provider  albuterol (PROVENTIL HFA;VENTOLIN HFA) 108 (90 Base) MCG/ACT inhaler Inhale 2 puffs into the lungs every 6 (six) hours as needed for wheezing or shortness of breath. 06/06/16  Yes Stephanie D English, PA  beclomethasone (QVAR) 40 MCG/ACT inhaler Inhale 1 puff into the lungs 2 (two) times daily. 06/06/16  Yes Stephanie D English, PA  busPIRone (BUSPAR) 15 MG tablet Take 15 mg by mouth 3 (three) times daily.   Yes Historical Provider, MD    Allergies  Allergen Reactions  . Lamotrigine Rash    Patient Active Problem List   Diagnosis Date Noted  . Depression 02/04/2016  . GAD (generalized anxiety disorder) 02/04/2016  . Mild intermittent asthma 07/16/2013    Past Medical History:  Diagnosis Date  . Arthritis   . Asthma     No past surgical history on file.  Social History   Social History  . Marital status: Single    Spouse name: N/A  . Number of children: N/A  . Years of education: N/A   Occupational History  . Not on file.   Social History Main Topics  . Smoking status: Never Smoker  . Smokeless tobacco: Never Used  . Alcohol use No  . Drug use: No  . Sexual activity: Yes   Other Topics Concern  . Not on file   Social History Narrative  . No narrative on file    Family History  Problem Relation Age of Onset  . Cancer Mother   . Hypertension Father   . Asthma Sister   . Arthritis Sister   . Schizophrenia Sister   . Cancer  Maternal Grandmother      Review of Systems  Constitutional: Negative for chills, fever and weight loss.  HENT: Negative for congestion, ear pain, nosebleeds and sore throat.   Eyes: Negative for blurred vision, double vision, discharge and redness.  Respiratory: Negative for cough and shortness of breath.   Cardiovascular: Negative for chest pain, palpitations and leg swelling.  Gastrointestinal: Negative for abdominal pain, diarrhea, nausea and vomiting.  Genitourinary: Negative for dysuria and hematuria.  Musculoskeletal: Positive for neck pain.  Skin: Negative for itching and rash.  Neurological: Positive for headaches. Negative for dizziness, sensory change, speech change, focal weakness, seizures and loss of consciousness.  Endo/Heme/Allergies: Negative.   Psychiatric/Behavioral: The patient is nervous/anxious.   All other systems reviewed and are negative.   Vitals:   07/26/16 0917  BP: 118/82  Pulse: 90  Resp: 16  Temp: 98.5 F (36.9 C)    Physical Exam  Constitutional: She is oriented to person, place, and time. She appears well-developed and well-nourished.  HENT:  Head: Normocephalic and atraumatic.  Nose: Nose normal.  Mouth/Throat: Oropharynx is clear and moist. No oropharyngeal exudate.  Eyes: Conjunctivae and EOM are normal. Pupils are equal, round, and reactive to light.  Neck: Neck supple. No JVD present. Muscular tenderness present. No spinous process  tenderness present. Decreased range of motion present. No thyromegaly present.  Cardiovascular: Normal rate, regular rhythm and normal heart sounds.   Pulmonary/Chest: Effort normal and breath sounds normal.  Abdominal: Soft. She exhibits no distension. There is no tenderness.  Musculoskeletal:       Cervical back: She exhibits tenderness and spasm. She exhibits no bony tenderness.  Lymphadenopathy:    She has no cervical adenopathy.  Neurological: She is alert and oriented to person, place, and time. She  displays normal reflexes. No cranial nerve deficit or sensory deficit. She exhibits normal muscle tone. Coordination normal.  Skin: Skin is warm and dry. Capillary refill takes less than 2 seconds.  Psychiatric: She has a normal mood and affect. Her behavior is normal.  Vitals reviewed.    ASSESSMENT & PLAN: Dafney was seen today for migraine.  Diagnoses and all orders for this visit:  Chronic tension-type headache, not intractable  GAD (generalized anxiety disorder) -     Ambulatory referral to Psychology  Trapezius muscle spasm  Other orders -     busPIRone (BUSPAR) 15 MG tablet; Take 1 tablet (15 mg total) by mouth 3 (three) times daily. -     diclofenac (VOLTAREN) 75 MG EC tablet; Take 1 tablet (75 mg total) by mouth 2 (two) times daily.    Patient Instructions       IF you received an x-ray today, you will receive an invoice from [facility]. Please contact [facility] at [phone] with questions or concerns regarding your invoice.   IF you received labwork today, you will receive an invoice from [place]. Please contact [facility] at [phone] with questions or concerns regarding your invoice.   Our billing staff will not be able to assist you with questions regarding bills from these companies.  You will be contacted with the lab results as soon as they are available. The fastest way to get your results is to activate your My Chart account. Instructions are located on the last page of this paperwork. If you have not heard from [place] regarding the results in 2 weeks, please contact this office.     Tension Headache A tension headache is a feeling of pain, pressure, or aching that is often felt over the front and sides of the head. The pain can be dull, or it can feel tight (constricting). Tension headaches are not normally associated with nausea or vomiting, and they do not get worse with physical activity. Tension headaches can last from 30 minutes  to several days. This is the most common type of headache. CAUSES The exact cause of this condition is not known. Tension headaches often begin after stress, anxiety, or depression. Other triggers may include:  Alcohol.  Too much caffeine, or caffeine withdrawal.  Respiratory infections, such as colds, flu, or sinus infections.  Dental problems or teeth clenching.  Fatigue.  Holding your head and neck in the same position for a long period of time, such as while using a computer.  Smoking. SYMPTOMS Symptoms of this condition include:  A feeling of pressure around the head.  Dull, aching head pain.  Pain felt over the front and sides of the head.  Tenderness in the muscles of the head, neck, and shoulders. DIAGNOSIS This condition may be diagnosed based on your symptoms and a physical exam. Tests may be done, such as a CT scan or an MRI of your head. These tests may be done if your symptoms are severe or unusual. TREATMENT This condition may  be treated with lifestyle changes and medicines to help relieve symptoms. HOME CARE INSTRUCTIONS Managing Pain   Take over-the-counter and prescription medicines only as told by your health care provider.  Lie down in a dark, quiet room when you have a headache.  If directed, apply ice to the head and neck area:  Put ice in a plastic bag.  Place a towel between your skin and the bag.  Leave the ice on for 20 minutes, 2-3 times per day.  Use a heating pad or a hot shower to apply heat to the head and neck area as told by your health care provider. Eating and Drinking   Eat meals on a regular schedule.  Limit alcohol use.  Decrease your caffeine intake, or stop using caffeine. General Instructions   Keep all follow-up visits as told by your health care provider. This is important.  Keep a headache journal to help find out what may trigger your headaches. For example, write down:  What you eat and drink.  How much sleep  you get.  Any change to your diet or medicines.  Try massage or other relaxation techniques.  Limit stress.  Sit up straight, and avoid tensing your muscles.  Do not use tobacco products, including cigarettes, chewing tobacco, or e-cigarettes. If you need help quitting, ask your health care provider.  Exercise regularly as told by your health care provider.  Get 7-9 hours of sleep, or the amount recommended by your health care provider. SEEK MEDICAL CARE IF:  Your symptoms are not helped by medicine.  You have a headache that is different from what you normally experience.  You have nausea or you vomit.  You have a fever. SEEK IMMEDIATE MEDICAL CARE IF:  Your headache becomes severe.  You have repeated vomiting.  You have a stiff neck.  You have a loss of vision.  You have problems with speech.  You have pain in your eye or ear.  You have muscular weakness or loss of muscle control.  You lose your balance or you have trouble walking.  You feel faint or you pass out.  You have confusion. This information is not intended to replace advice given to you by your health care provider. Make sure you discuss any questions you have with your health care provider. Document Released: 04/24/2005 Document Revised: 01/13/2015 Document Reviewed: 08/17/2014 Elsevier Interactive Patient Education  2017 Elsevier Inc.      Edwina Barth, MD [facility]

## 2016-07-26 NOTE — Patient Instructions (Addendum)
   IF you received an x-ray today, you will receive an invoice from [facility]. Please contact [facility] at [phone] with questions or concerns regarding your invoice.   IF you received labwork today, you will receive an invoice from [facility]. Please contact [facility] at [phone] with questions or concerns regarding your invoice.   Our billing staff will not be able to assist you with questions regarding bills from these companies.  You will be contacted with the lab results as soon as they are available. The fastest way to get your results is to activate your My Chart account. Instructions are located on the last page of this paperwork. If you have not heard from us regarding the results in 2 weeks, please contact this office.     Tension Headache A tension headache is a feeling of pain, pressure, or aching that is often felt over the front and sides of the head. The pain can be dull, or it can feel tight (constricting). Tension headaches are not normally associated with nausea or vomiting, and they do not get worse with physical activity. Tension headaches can last from 30 minutes to several days. This is the most common type of headache. CAUSES The exact cause of this condition is not known. Tension headaches often begin after stress, anxiety, or depression. Other triggers may include:  Alcohol.  Too much caffeine, or caffeine withdrawal.  Respiratory infections, such as colds, flu, or sinus infections.  Dental problems or teeth clenching.  Fatigue.  Holding your head and neck in the same position for a long period of time, such as while using a computer.  Smoking. SYMPTOMS Symptoms of this condition include:  A feeling of pressure around the head.  Dull, aching head pain.  Pain felt over the front and sides of the head.  Tenderness in the muscles of the head, neck, and shoulders. DIAGNOSIS This condition may be diagnosed based on your  symptoms and a physical exam. Tests may be done, such as a CT scan or an MRI of your head. These tests may be done if your symptoms are severe or unusual. TREATMENT This condition may be treated with lifestyle changes and medicines to help relieve symptoms. HOME CARE INSTRUCTIONS Managing Pain  Take over-the-counter and prescription medicines only as told by your health care provider.  Lie down in a dark, quiet room when you have a headache.  If directed, apply ice to the head and neck area: ? Put ice in a plastic bag. ? Place a towel between your skin and the bag. ? Leave the ice on for 20 minutes, 2-3 times per day.  Use a heating pad or a hot shower to apply heat to the head and neck area as told by your health care provider. Eating and Drinking  Eat meals on a regular schedule.  Limit alcohol use.  Decrease your caffeine intake, or stop using caffeine. General Instructions  Keep all follow-up visits as told by your health care provider. This is important.  Keep a headache journal to help find out what may trigger your headaches. For example, write down: ? What you eat and drink. ? How much sleep you get. ? Any change to your diet or medicines.  Try massage or other relaxation techniques.  Limit stress.  Sit up straight, and avoid tensing your muscles.  Do not use tobacco products, including cigarettes, chewing tobacco, or e-cigarettes. If you need help quitting, ask your health care provider.  Exercise regularly as   told by your health care provider.  Get 7-9 hours of sleep, or the amount recommended by your health care provider. SEEK MEDICAL CARE IF:  Your symptoms are not helped by medicine.  You have a headache that is different from what you normally experience.  You have nausea or you vomit.  You have a fever. SEEK IMMEDIATE MEDICAL CARE IF:  Your headache becomes severe.  You have repeated vomiting.  You have a stiff neck.  You have a loss of  vision.  You have problems with speech.  You have pain in your eye or ear.  You have muscular weakness or loss of muscle control.  You lose your balance or you have trouble walking.  You feel faint or you pass out.  You have confusion. This information is not intended to replace advice given to you by your health care provider. Make sure you discuss any questions you have with your health care provider. Document Released: 04/24/2005 Document Revised: 01/13/2015 Document Reviewed: 08/17/2014 Elsevier Interactive Patient Education  2017 Elsevier Inc.  

## 2016-07-28 ENCOUNTER — Ambulatory Visit (INDEPENDENT_AMBULATORY_CARE_PROVIDER_SITE_OTHER): Payer: BLUE CROSS/BLUE SHIELD

## 2016-07-28 ENCOUNTER — Ambulatory Visit (INDEPENDENT_AMBULATORY_CARE_PROVIDER_SITE_OTHER): Payer: BLUE CROSS/BLUE SHIELD | Admitting: Physician Assistant

## 2016-07-28 VITALS — BP 119/77 | HR 83 | Temp 98.5°F | Resp 17 | Ht 63.0 in | Wt 187.0 lb

## 2016-07-28 DIAGNOSIS — R202 Paresthesia of skin: Secondary | ICD-10-CM | POA: Diagnosis not present

## 2016-07-28 DIAGNOSIS — M542 Cervicalgia: Secondary | ICD-10-CM | POA: Diagnosis not present

## 2016-07-28 DIAGNOSIS — M9901 Segmental and somatic dysfunction of cervical region: Secondary | ICD-10-CM | POA: Diagnosis not present

## 2016-07-28 DIAGNOSIS — M5020 Other cervical disc displacement, unspecified cervical region: Secondary | ICD-10-CM | POA: Diagnosis not present

## 2016-07-28 DIAGNOSIS — J4531 Mild persistent asthma with (acute) exacerbation: Secondary | ICD-10-CM | POA: Diagnosis not present

## 2016-07-28 DIAGNOSIS — M9902 Segmental and somatic dysfunction of thoracic region: Secondary | ICD-10-CM | POA: Diagnosis not present

## 2016-07-28 DIAGNOSIS — M069 Rheumatoid arthritis, unspecified: Secondary | ICD-10-CM | POA: Insufficient documentation

## 2016-07-28 MED ORDER — CYCLOBENZAPRINE HCL 10 MG PO TABS: 10.0000 mg | ORAL_TABLET | Freq: Three times a day (TID) | ORAL | 0 refills | Status: DC | PRN

## 2016-07-28 NOTE — Patient Instructions (Addendum)
STOP the diclofenac. Use the cyclobenzaprine (Flexeril) up to 3 times daily. It causes drowsiness, so do not take it when you need to be alert (like driving, at work). Continue with the stretching that the chiropractor recommends. INCREASE the Qvar to 2 puffs twice a day.    IF you received an x-ray today, you will receive an invoice from [facility]. Please contact [facility] at [phone] with questions or concerns regarding your invoice.   IF you received labwork today, you will receive an invoice from [place]. Please contact [facility] at [phone] with questions or concerns regarding your invoice.   Our billing staff will not be able to assist you with questions regarding bills from these companies.  You will be contacted with the lab results as soon as they are available. The fastest way to get your results is to activate your My Chart account. Instructions are located on the last page of this paperwork. If you have not heard from [place] regarding the results in 2 weeks, please contact this office.
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
## 2016-08-01 DIAGNOSIS — M542 Cervicalgia: Secondary | ICD-10-CM | POA: Diagnosis not present

## 2016-08-01 DIAGNOSIS — M9901 Segmental and somatic dysfunction of cervical region: Secondary | ICD-10-CM | POA: Diagnosis not present

## 2016-08-01 DIAGNOSIS — M5020 Other cervical disc displacement, unspecified cervical region: Secondary | ICD-10-CM | POA: Diagnosis not present

## 2016-08-01 DIAGNOSIS — M9902 Segmental and somatic dysfunction of thoracic region: Secondary | ICD-10-CM | POA: Diagnosis not present

## 2016-08-23 ENCOUNTER — Ambulatory Visit: Payer: BLUE CROSS/BLUE SHIELD | Admitting: Physician Assistant

## 2016-09-11 ENCOUNTER — Other Ambulatory Visit: Payer: Self-pay | Admitting: Emergency Medicine

## 2016-11-07 ENCOUNTER — Other Ambulatory Visit: Payer: Self-pay | Admitting: Family Medicine

## 2016-11-07 DIAGNOSIS — J4531 Mild persistent asthma with (acute) exacerbation: Secondary | ICD-10-CM

## 2016-12-01 ENCOUNTER — Other Ambulatory Visit: Payer: Self-pay

## 2016-12-01 ENCOUNTER — Other Ambulatory Visit: Payer: Self-pay | Admitting: Physician Assistant

## 2016-12-01 DIAGNOSIS — J4531 Mild persistent asthma with (acute) exacerbation: Secondary | ICD-10-CM

## 2016-12-01 MED ORDER — ALBUTEROL SULFATE HFA 108 (90 BASE) MCG/ACT IN AERS: 2.0000 | INHALATION_SPRAY | Freq: Four times a day (QID) | RESPIRATORY_TRACT | 0 refills | Status: DC | PRN

## 2016-12-27 ENCOUNTER — Ambulatory Visit (INDEPENDENT_AMBULATORY_CARE_PROVIDER_SITE_OTHER): Payer: BLUE CROSS/BLUE SHIELD | Admitting: Physician Assistant

## 2016-12-27 ENCOUNTER — Encounter: Payer: Self-pay | Admitting: Physician Assistant

## 2016-12-27 DIAGNOSIS — J4531 Mild persistent asthma with (acute) exacerbation: Secondary | ICD-10-CM | POA: Diagnosis not present

## 2016-12-27 MED ORDER — BUDESONIDE-FORMOTEROL FUMARATE 80-4.5 MCG/ACT IN AERO: 2.0000 | INHALATION_SPRAY | Freq: Two times a day (BID) | RESPIRATORY_TRACT | 5 refills | Status: DC

## 2016-12-27 MED ORDER — ALBUTEROL SULFATE HFA 108 (90 BASE) MCG/ACT IN AERS: 2.0000 | INHALATION_SPRAY | Freq: Four times a day (QID) | RESPIRATORY_TRACT | 5 refills | Status: DC | PRN

## 2016-12-27 NOTE — Progress Notes (Signed)
PRIMARY CARE AT [facility] [address] [place] [address] [phone]  Date:  12/27/2016   Name:  Terri Ross   DOB:  07-11-1990   MRN:  [id number]  PCP:  Garnetta Buddy, PA    History of Present Illness:  Terri Ross is a 26 y.o. female patient who presents to PCP with  Chief Complaint  Patient presents with  . Asthma    follow-up  . Medication Refill    Albuterol and something cheaper then the Qvar    Patient has been without the qvar for the last 2 months, due to cost.  She is controlling her asthma fine, she reports.  She needs the rescue inhaler, albuterol, twice per week.  She is not taking anything for her allergies, currently.  She has no sob, dyspnea, or dizziness.  She is keeping her home clean, pillowcases washed--though she continues to have her pets, who may sleep in her room occasionally.  She reports that she keeps air filters as well.      Patient Active Problem List   Diagnosis Date Noted  . Rheumatoid arthritis (HCC) 07/28/2016  . Chronic tension-type headache, not intractable 07/26/2016  . Depression 02/04/2016  . GAD (generalized anxiety disorder) 02/04/2016  . Mild intermittent asthma 07/16/2013    Past Medical History:  Diagnosis Date  . Anxiety   . Arthritis   . Asthma   . Rheumatoid arthritis [facility]     Past Surgical History:  Procedure Laterality Date  . NO PAST SURGERIES      Social History  Substance Use Topics  . Smoking status: Never Smoker  . Smokeless tobacco: Never Used  . Alcohol use No    Family History  Problem Relation Age of Onset  . Hypertension Father   . Anxiety disorder Father   . Asthma Sister   . Arthritis Sister   . Schizophrenia Sister   . Cancer Maternal Grandmother   . Cancer Paternal Grandmother        lung, POSITIVE TOBACCO    Allergies  Allergen Reactions  . Voltaren [Diclofenac Sodium] Shortness Of Breath  . Lamotrigine Rash    Medication list has been reviewed and updated.  Current  Outpatient Prescriptions on File Prior to Visit  Medication Sig Dispense Refill  . albuterol (PROVENTIL HFA;VENTOLIN HFA) 108 (90 Base) MCG/ACT inhaler Inhale 2 puffs into the lungs every 6 (six) hours as needed for wheezing or shortness of breath. 6.7 g 0  . beclomethasone (QVAR) 40 MCG/ACT inhaler Inhale 1 puff into the lungs 2 (two) times daily. 1 Inhaler 5  . busPIRone (BUSPAR) 15 MG tablet Take 1 tablet (15 mg total) by mouth 3 (three) times daily. 90 tablet 2  . cyclobenzaprine (FLEXERIL) 10 MG tablet Take 1 tablet (10 mg total) by mouth 3 (three) times daily as needed for muscle spasms. 30 tablet 0   No current facility-administered medications on file prior to visit.     ROS ROS otherwise unremarkable unless listed above.  Physical Examination: BP 117/80   Pulse 78   Temp 97.7 F (36.5 C) (Oral)   Resp 18   Ht 5\' 1"  (1.549 m)   Wt 190 lb (86.2 kg)   SpO2 98%   BMI 35.90 kg/m  Ideal Body Weight: Weight in (lb) to have BMI = 25: 132  Physical Exam  Constitutional: She is oriented to person, place, and time. She appears well-developed and well-nourished. No distress.  HENT:  Head: Normocephalic and atraumatic.  Right  Ear: Tympanic membrane, external ear and ear canal normal.  Left Ear: Tympanic membrane, external ear and ear canal normal.  Nose: No mucosal edema (mild pallor and edema to the nasal turbinates.) or rhinorrhea. Right sinus exhibits no maxillary sinus tenderness and no frontal sinus tenderness. Left sinus exhibits no maxillary sinus tenderness and no frontal sinus tenderness.  Mouth/Throat: No uvula swelling. No oropharyngeal exudate, posterior oropharyngeal edema or posterior oropharyngeal erythema.  Eyes: Pupils are equal, round, and reactive to light. Conjunctivae and EOM are normal.  Cardiovascular: Normal rate and regular rhythm.  Exam reveals no gallop, no distant heart sounds and no friction rub.   No murmur heard. Pulmonary/Chest: Effort normal. No  apnea. No respiratory distress. She has no decreased breath sounds. She has no wheezes. She has no rhonchi.  Lymphadenopathy:       Head (right side): No submandibular, no tonsillar, no preauricular and no posterior auricular adenopathy present.       Head (left side): No submandibular, no tonsillar, no preauricular and no posterior auricular adenopathy present.  Neurological: She is alert and oriented to person, place, and time.  Skin: She is not diaphoretic.  Psychiatric: She has a normal mood and affect. Her behavior is normal.     Assessment and Plan: Terri Ross is a 26 y.o. female who is here today for allergy and asthma medication refill. Advised to refill the zyrtec.  Will stop the qvar, and attempt symbicort. Given coupon. Mild persistent asthma with exacerbation - Plan: albuterol (PROVENTIL HFA;VENTOLIN HFA) 108 (90 Base) MCG/ACT inhaler, budesonide-formoterol (SYMBICORT) 80-4.5 MCG/ACT inhaler  Trena Platt, PA-C [facility] 8/22/20187:53 PM

## 2016-12-27 NOTE — Patient Instructions (Addendum)
You have refills of the zyrtec.  You can call the pharmacy for refills until January I would like you to try the symbicort.   Please take as prescribed.   IF you received an x-ray today, you will receive an invoice from [facility]. Please contact [facility] at [phone] with questions or concerns regarding your invoice.   IF you received labwork today, you will receive an invoice from [facility]. Please contact [facility] at [phone] with questions or concerns regarding your invoice.   Our billing staff will not be able to assist you with questions regarding bills from these companies.  You will be contacted with the lab results as soon as they are available. The fastest way to get your results is to activate your My Chart account. Instructions are located on the last page of this paperwork. If you have not heard from [place] regarding the results in 2 weeks, please contact this office.

## 2016-12-28 ENCOUNTER — Ambulatory Visit (INDEPENDENT_AMBULATORY_CARE_PROVIDER_SITE_OTHER): Payer: BLUE CROSS/BLUE SHIELD

## 2016-12-28 ENCOUNTER — Ambulatory Visit (INDEPENDENT_AMBULATORY_CARE_PROVIDER_SITE_OTHER): Payer: BLUE CROSS/BLUE SHIELD | Admitting: Family Medicine

## 2016-12-28 ENCOUNTER — Other Ambulatory Visit: Payer: Self-pay

## 2016-12-28 ENCOUNTER — Encounter: Payer: Self-pay | Admitting: Family Medicine

## 2016-12-28 VITALS — BP 127/85 | HR 78 | Temp 97.5°F | Resp 16 | Ht 61.5 in | Wt 193.4 lb

## 2016-12-28 DIAGNOSIS — S63636A Sprain of interphalangeal joint of right little finger, initial encounter: Secondary | ICD-10-CM

## 2016-12-28 DIAGNOSIS — M79644 Pain in right finger(s): Secondary | ICD-10-CM

## 2016-12-28 NOTE — Progress Notes (Signed)
8/23/201810:40 AM  Ponciano Ort 1990/12/16, 26 y.o. female [id number]  Chief Complaint  Patient presents with  . pinky finger     hurts, can't bend x 3 days    HPI:   Patient is a 26 y.o. female who presents today for 3 days of right pinky pain along knuckle. 3 days ago writing for over 7 hours and suddenly felt a "pop" in her knuckle. It was very painful and she "popped" it again wo improvement in pain, since then it was been swollen, hurting and unable to bend. She has tried ice, splint and APAP wo relief. NSAIDs worsen her asthma  Depression screen [facility] 2/9 12/28/2016 12/27/2016 07/28/2016  Decreased Interest 0 0 0  Down, Depressed, Hopeless 0 0 0  PHQ - 2 Score 0 0 0    Allergies  Allergen Reactions  . Voltaren [Diclofenac Sodium] Shortness Of Breath  . Lamotrigine Rash    Current Outpatient Prescriptions on File Prior to Visit  Medication Sig Dispense Refill  . albuterol (PROVENTIL HFA;VENTOLIN HFA) 108 (90 Base) MCG/ACT inhaler Inhale 2 puffs into the lungs every 6 (six) hours as needed for wheezing or shortness of breath. 6.7 g 5  . budesonide-formoterol (SYMBICORT) 80-4.5 MCG/ACT inhaler Inhale 2 puffs into the lungs 2 (two) times daily. 1 Inhaler 5  . busPIRone (BUSPAR) 15 MG tablet Take 1 tablet (15 mg total) by mouth 3 (three) times daily. 90 tablet 2  . cyclobenzaprine (FLEXERIL) 10 MG tablet Take 1 tablet (10 mg total) by mouth 3 (three) times daily as needed for muscle spasms. 30 tablet 0  . beclomethasone (QVAR) 40 MCG/ACT inhaler Inhale 1 puff into the lungs 2 (two) times daily. (Patient not taking: Reported on 12/28/2016) 1 Inhaler 5   No current facility-administered medications on file prior to visit.     Past Medical History:  Diagnosis Date  . Anxiety   . Arthritis   . Asthma   . Rheumatoid arthritis [facility])     Past Surgical History:  Procedure Laterality Date  . NO PAST SURGERIES      Social History  Substance Use Topics  . Smoking status:  Never Smoker  . Smokeless tobacco: Never Used  . Alcohol use No    Family History  Problem Relation Age of Onset  . Hypertension Father   . Anxiety disorder Father   . Asthma Sister   . Arthritis Sister   . Schizophrenia Sister   . Cancer Maternal Grandmother   . Cancer Paternal Grandmother        lung, POSITIVE TOBACCO    ROS + joint pain and swelling - h/o past trauma, numbness and tingling, skin changes   OBJECTIVE:  Blood pressure 127/85, pulse 78, temperature (!) 97.5 F (36.4 C), temperature source Oral, resp. rate 16, height 5' 1.5" (1.562 m), weight 193 lb 6.4 oz (87.7 kg), last menstrual period 12/14/2016, SpO2 98 %.  Physical Exam GEN: AAOx3, NAD MSK: LEFT hand normal, Right wrist FROM, Right 5th digit held in extension/abduction, mild swelling of PIP, FROM of MCP and DIP but significantly decreased and painful across PIP. No ligament laxity noted. Distal sensation and cap refill normal.     ASSESSMENT and PLAN:  1. Sprain of interphalangeal joint of right little finger, initial encounter Xray negative. Discussed most likely sprain of collateral ligament. Patient educational handout given. Finger buddy taped in clinic. Discussed buddy tapping 2-4 weeks, rice therapy, apap use instead of nsaids use.   2. Finger pain, right -  DG Finger Little Right; Future - negative     Myles Lipps, MD Primary Care at [facility] [address] [place], [place] [address] Ph.  [phone] Fax [phone]

## 2016-12-28 NOTE — Patient Instructions (Signed)
     IF you received an x-ray today, you will receive an invoice from [facility]. Please contact [facility] at [phone] with questions or concerns regarding your invoice.   IF you received labwork today, you will receive an invoice from [facility]. Please contact [facility] at [phone] with questions or concerns regarding your invoice.   Our billing staff will not be able to assist you with questions regarding bills from these companies.  You will be contacted with the lab results as soon as they are available. The fastest way to get your results is to activate your My Chart account. Instructions are located on the last page of this paperwork. If you have not heard from us regarding the results in 2 weeks, please contact this office.     

## 2017-01-11 ENCOUNTER — Ambulatory Visit: Payer: BLUE CROSS/BLUE SHIELD | Admitting: Family Medicine

## 2017-01-15 ENCOUNTER — Ambulatory Visit: Payer: BLUE CROSS/BLUE SHIELD | Admitting: Family Medicine

## 2017-02-05 ENCOUNTER — Telehealth: Payer: Self-pay

## 2017-02-05 NOTE — Telephone Encounter (Signed)
LMVM reminding patient of over due health maintenance, including pap smear.

## 2017-02-28 ENCOUNTER — Encounter: Payer: Self-pay | Admitting: Family Medicine

## 2017-02-28 ENCOUNTER — Ambulatory Visit (INDEPENDENT_AMBULATORY_CARE_PROVIDER_SITE_OTHER): Payer: BLUE CROSS/BLUE SHIELD | Admitting: Family Medicine

## 2017-02-28 VITALS — BP 124/82 | HR 96 | Temp 98.8°F | Resp 17 | Ht 61.5 in | Wt 197.4 lb

## 2017-02-28 DIAGNOSIS — L739 Follicular disorder, unspecified: Secondary | ICD-10-CM

## 2017-02-28 MED ORDER — CLINDAMYCIN PHOSPHATE 1 % EX LOTN: TOPICAL_LOTION | Freq: Two times a day (BID) | CUTANEOUS | 0 refills | Status: DC

## 2017-02-28 MED ORDER — DOXYCYCLINE HYCLATE 100 MG PO TABS: 100.0000 mg | ORAL_TABLET | Freq: Two times a day (BID) | ORAL | 0 refills | Status: DC

## 2017-02-28 NOTE — Patient Instructions (Addendum)
   IF you received an x-ray today, you will receive an invoice from [facility]. Please contact [facility] at [phone] with questions or concerns regarding your invoice.   IF you received labwork today, you will receive an invoice from [facility]. Please contact [facility] at [phone] with questions or concerns regarding your invoice.   Our billing staff will not be able to assist you with questions regarding bills from these companies.  You will be contacted with the lab results as soon as they are available. The fastest way to get your results is to activate your My Chart account. Instructions are located on the last page of this paperwork. If you have not heard from us regarding the results in 2 weeks, please contact this office.     Folliculitis Folliculitis is inflammation of the hair follicles. Folliculitis most commonly occurs on the scalp, thighs, legs, back, and buttocks. However, it can occur anywhere on the body. What are the causes? This condition may be caused by:  A bacterial infection (common).  A fungal infection.  A viral infection.  Coming into contact with certain chemicals, especially oils and tars.  Shaving or waxing.  Applying greasy ointments or creams to your skin often.  Long-lasting folliculitis and folliculitis that keeps coming back can be caused by bacteria that live in the nostrils. What increases the risk? This condition is more likely to develop in people with:  A weakened immune system.  Diabetes.  Obesity.  What are the signs or symptoms? Symptoms of this condition include:  Redness.  Soreness.  Swelling.  Itching.  Small white or yellow, pus-filled, itchy spots (pustules) that appear over a reddened area. If there is an infection that goes deep into the follicle, these may develop into a boil (furuncle).  A group of closely packed boils (carbuncle). These tend to form in hairy, sweaty areas of the  body.  How is this diagnosed? This condition is diagnosed with a skin exam. To find what is causing the condition, your health care provider may take a sample of one of the pustules or boils for testing. How is this treated? This condition may be treated by:  Applying warm compresses to the affected areas.  Taking an antibiotic medicine or applying an antibiotic medicine to the skin.  Applying or bathing with an antiseptic solution.  Taking an over-the-counter medicine to help with itching.  Having a procedure to drain any pustules or boils. This may be done if a pustule or boil contains a lot of pus or fluid.  Laser hair removal. This may be done to treat long-lasting folliculitis.  Follow these instructions at home:  If directed, apply heat to the affected area as often as told by your health care provider. Use the heat source that your health care provider recommends, such as a moist heat pack or a heating pad. ? Place a towel between your skin and the heat source. ? Leave the heat on for 20-30 minutes. ? Remove the heat if your skin turns bright red. This is especially important if you are unable to feel pain, heat, or cold. You may have a greater risk of getting burned.  If you were prescribed an antibiotic medicine, use it as told by your health care provider. Do not stop using the antibiotic even if you start to feel better.  Take over-the-counter and prescription medicines only as told by your health care provider.  Do not shave irritated skin.  Keep all follow-up visits   as told by your health care provider. This is important. Get help right away if:  You have more redness, swelling, or pain in the affected area.  Red streaks are spreading from the affected area.  You have a fever. This information is not intended to replace advice given to you by your health care provider. Make sure you discuss any questions you have with your health care provider. Document Released:  07/03/2001 Document Revised: 11/12/2015 Document Reviewed: 02/12/2015 Elsevier Interactive Patient Education  2018 Elsevier Inc.  

## 2017-02-28 NOTE — Progress Notes (Signed)
Chief Complaint  Patient presents with  . ? boil in groin area    onset: 02/22/17, started draining today, starting draining white pus w/ tinge of blood now clear with a little blood.  Using antibacterial soap to clean and antibiotic ointment.   Per pt has depression and didn't bathe for 2 weeks and thinks this how she developed the boil.  Pt refuses to put on gown as it will cause her to have a panic attack, hospital gowns trigger her ptsd.  Pt will pull down boxer for MD to see boil.  Refuses flu vaccine.    HPI  Patient with bump on groin She states that she was using antibacterial soap and that the area had a large pus bump which burst and drained white fluid  She denies fever or chills Onset was Thursday which was 6 days ago Her pain is 2/10 but it is sore when touched   4 review of systems  Past Medical History:  Diagnosis Date  . Anxiety   . Arthritis   . Asthma   . Rheumatoid arthritis (HCC)     Current Outpatient Prescriptions  Medication Sig Dispense Refill  . albuterol (PROVENTIL HFA;VENTOLIN HFA) 108 (90 Base) MCG/ACT inhaler Inhale 2 puffs into the lungs every 6 (six) hours as needed for wheezing or shortness of breath. 6.7 g 5  . budesonide-formoterol (SYMBICORT) 80-4.5 MCG/ACT inhaler Inhale 2 puffs into the lungs 2 (two) times daily. 1 Inhaler 5  . busPIRone (BUSPAR) 15 MG tablet Take 1 tablet (15 mg total) by mouth 3 (three) times daily. 90 tablet 2  . cetirizine (ZYRTEC) 10 MG tablet Take 10 mg by mouth daily.    . cyclobenzaprine (FLEXERIL) 10 MG tablet Take 1 tablet (10 mg total) by mouth 3 (three) times daily as needed for muscle spasms. 30 tablet 0  . beclomethasone (QVAR) 40 MCG/ACT inhaler Inhale 1 puff into the lungs 2 (two) times daily. (Patient not taking: Reported on 12/28/2016) 1 Inhaler 5  . clindamycin (CLEOCIN-T) 1 % lotion Apply topically 2 (two) times daily. 60 mL 0  . doxycycline (VIBRA-TABS) 100 MG tablet Take 1 tablet (100 mg total) by mouth 2  (two) times daily. 20 tablet 0   No current facility-administered medications for this visit.     Allergies:  Allergies  Allergen Reactions  . Voltaren [Diclofenac Sodium] Shortness Of Breath  . Lamotrigine Rash    Past Surgical History:  Procedure Laterality Date  . NO PAST SURGERIES      Social History   Social History  . Marital status: Single    Spouse name: N/A  . Number of children: 0  . Years of education: high school   Occupational History  . security     [employer]   Social History Main Topics  . Smoking status: Never Smoker  . Smokeless tobacco: Never Used  . Alcohol use No  . Drug use: No  . Sexual activity: Yes   Other Topics Concern  . None   Social History Narrative   Originally from [place].   Was raised by her paternal grandparents after her biological mother abandoned her and her twin sister as infants with their father, who was not able to raise them alone. They adopted the girls.   She ran away at age 72.    Talks to her father daily by phone.   Lives with 2 roommates.    Family History  Problem Relation Age of Onset  . Hypertension Father   .  Anxiety disorder Father   . Asthma Sister   . Arthritis Sister   . Schizophrenia Sister   . Cancer Maternal Grandmother   . Cancer Paternal Grandmother        lung, POSITIVE TOBACCO     ROS Review of Systems See HPI Constitution: No fevers or chills No malaise No diaphoresis Skin: No rash or itching Eyes: no blurry vision, no double vision GU: no dysuria or hematuria Neuro: no dizziness or headaches  Objective: Vitals:   02/28/17 1034  BP: 124/82  Pulse: 96  Resp: 17  Temp: 98.8 F (37.1 C)  TempSrc: Oral  SpO2: 97%  Weight: 197 lb 6.4 oz (89.5 kg)  Height: 5' 1.5" (1.562 m)    Physical Exam  Constitutional: She appears well-developed and well-nourished.  HENT:  Head: Normocephalic and atraumatic.  Eyes: Conjunctivae and EOM are normal.  Genitourinary:  Genitourinary  Comments: mon pubis noted to have area of erythema and induration No purulent drainaged expressed     Assessment and Plan Cynitha was seen today for ? boil in groin area.  Diagnoses and all orders for this visit:  Acute folliculitis Lanced on its own at home Advised topical and systemic abx Gave home care handout Follow up prn  Other orders -     clindamycin (CLEOCIN-T) 1 % lotion; Apply topically 2 (two) times daily. -     doxycycline (VIBRA-TABS) 100 MG tablet; Take 1 tablet (100 mg total) by mouth 2 (two) times daily.     Gilda Abboud A Dimond Crotty

## 2017-03-06 ENCOUNTER — Telehealth: Payer: Self-pay | Admitting: Family Medicine

## 2017-03-06 NOTE — Telephone Encounter (Signed)
Pt calling stating that the Doxycycline makes her sick and nauseated and that she cant eat she will not take any more of it until she get another prescription for something else

## 2017-03-07 NOTE — Telephone Encounter (Signed)
Forwarded note to [facility]

## 2017-03-07 NOTE — Telephone Encounter (Signed)
Please let the patient know that doxycycline can cause stomach irritation. She can stop the oral pill and just do the topical medication cleocin.  Until her stomach symptoms stop I would not start another antibiotic right now as that can make the stomach continue to be upset.

## 2017-03-08 NOTE — Telephone Encounter (Signed)
Pt advised.

## 2017-05-17 ENCOUNTER — Other Ambulatory Visit: Payer: Self-pay

## 2017-05-17 ENCOUNTER — Telehealth: Payer: Self-pay | Admitting: Physician Assistant

## 2017-05-17 DIAGNOSIS — J4531 Mild persistent asthma with (acute) exacerbation: Secondary | ICD-10-CM

## 2017-05-17 MED ORDER — ALBUTEROL SULFATE HFA 108 (90 BASE) MCG/ACT IN AERS: 2.0000 | INHALATION_SPRAY | Freq: Four times a day (QID) | RESPIRATORY_TRACT | 0 refills | Status: DC | PRN

## 2017-05-17 NOTE — Telephone Encounter (Signed)
Copied from CRM [id number]. Topic: Quick Communication - Rx Refill/Question >> May 17, 2017  2:37 PM Louie Bun, Rosey Bath D wrote: Medication: albuterol (PROVENTIL HFA;VENTOLIN HFA) 108 (90 Base) MCG/ACT inhaler Has the patient contacted their pharmacy?Yes (Agent: If no, request that the patient contact the pharmacy for the refill.) Preferred Pharmacy (with phone number or street name): [id number] - [place], [place] - [address] Agent: Please be advised that RX refills may take up to 3 business days. We ask that you follow-up with your pharmacy. Patient is completely out of her inhaler and needs a refill asap.

## 2017-05-18 ENCOUNTER — Other Ambulatory Visit: Payer: Self-pay | Admitting: Physician Assistant

## 2017-05-18 DIAGNOSIS — J4531 Mild persistent asthma with (acute) exacerbation: Secondary | ICD-10-CM

## 2017-06-13 ENCOUNTER — Encounter: Payer: Self-pay | Admitting: Physician Assistant

## 2017-06-13 ENCOUNTER — Ambulatory Visit: Payer: BLUE CROSS/BLUE SHIELD | Admitting: Physician Assistant

## 2017-06-13 ENCOUNTER — Ambulatory Visit (INDEPENDENT_AMBULATORY_CARE_PROVIDER_SITE_OTHER): Payer: BLUE CROSS/BLUE SHIELD

## 2017-06-13 ENCOUNTER — Other Ambulatory Visit: Payer: Self-pay

## 2017-06-13 VITALS — BP 122/84 | HR 98 | Temp 97.9°F | Resp 18 | Ht 62.01 in | Wt 192.8 lb

## 2017-06-13 DIAGNOSIS — M545 Low back pain, unspecified: Secondary | ICD-10-CM

## 2017-06-13 DIAGNOSIS — R202 Paresthesia of skin: Secondary | ICD-10-CM

## 2017-06-13 DIAGNOSIS — J4531 Mild persistent asthma with (acute) exacerbation: Secondary | ICD-10-CM | POA: Diagnosis not present

## 2017-06-13 DIAGNOSIS — M533 Sacrococcygeal disorders, not elsewhere classified: Secondary | ICD-10-CM | POA: Diagnosis not present

## 2017-06-13 DIAGNOSIS — M5441 Lumbago with sciatica, right side: Secondary | ICD-10-CM

## 2017-06-13 MED ORDER — CYCLOBENZAPRINE HCL 10 MG PO TABS: 5.0000 mg | ORAL_TABLET | Freq: Three times a day (TID) | ORAL | 0 refills | Status: DC | PRN

## 2017-06-13 MED ORDER — ALBUTEROL SULFATE HFA 108 (90 BASE) MCG/ACT IN AERS: INHALATION_SPRAY | RESPIRATORY_TRACT | 6 refills | Status: DC

## 2017-06-13 MED ORDER — PREDNISONE 20 MG PO TABS: ORAL_TABLET | ORAL | 0 refills | Status: DC

## 2017-06-13 NOTE — Progress Notes (Signed)
PRIMARY CARE AT [facility] [address] [place] [address] [phone]  Date:  06/13/2017   Name:  Terri Ross   DOB:  Jan 12, 1991   MRN:  [id number]  PCP:  Garnetta Buddy, PA    History of Present Illness:  Terri Ross is a 27 y.o. female patient who presents to PCP with  Chief Complaint  Patient presents with  . Back Pain    X 1 1/2 lower back  . Medication Refill    pro air     1-2 weeks ago, back pain that has progressively worsened in the last 5 days.  She has a popping sensation.  Pain wax and waning.  She has some paresthesia and buttock pain of right side, but has some pain at er .  5/10 pain.  Center of back pain.   She needs a refill of the albuterol at this time.  No current sob or dyspnea.  Patient Active Problem List   Diagnosis Date Noted  . Rheumatoid arthritis (HCC) 07/28/2016  . Chronic tension-type headache, not intractable 07/26/2016  . Depression 02/04/2016  . GAD (generalized anxiety disorder) 02/04/2016  . Mild intermittent asthma 07/16/2013    Past Medical History:  Diagnosis Date  . Anxiety   . Arthritis   . Asthma   . Rheumatoid arthritis [facility])     Past Surgical History:  Procedure Laterality Date  . NO PAST SURGERIES      Social History   Tobacco Use  . Smoking status: Never Smoker  . Smokeless tobacco: Never Used  Substance Use Topics  . Alcohol use: No  . Drug use: No    Family History  Problem Relation Age of Onset  . Hypertension Father   . Anxiety disorder Father   . Asthma Sister   . Arthritis Sister   . Schizophrenia Sister   . Cancer Maternal Grandmother   . Cancer Paternal Grandmother        lung, POSITIVE TOBACCO    Allergies  Allergen Reactions  . Voltaren [Diclofenac Sodium] Shortness Of Breath  . Doxycycline   . Lamotrigine Rash    Medication list has been reviewed and updated.  Current Outpatient Medications on File Prior to Visit  Medication Sig Dispense Refill  . albuterol (PROVENTIL  HFA;VENTOLIN HFA) 108 (90 Base) MCG/ACT inhaler INHALE 2 PUFFS EVERY 6 HOURS AS NEEDED FOR WHEEZING OR SHORTNESS OF BREATH (Patient not taking: Reported on 06/13/2017) 8.5 Inhaler 0  . beclomethasone (QVAR) 40 MCG/ACT inhaler Inhale 1 puff into the lungs 2 (two) times daily. (Patient not taking: Reported on 12/28/2016) 1 Inhaler 5  . budesonide-formoterol (SYMBICORT) 80-4.5 MCG/ACT inhaler Inhale 2 puffs into the lungs 2 (two) times daily. 1 Inhaler 5  . busPIRone (BUSPAR) 15 MG tablet Take 1 tablet (15 mg total) by mouth 3 (three) times daily. 90 tablet 2  . cetirizine (ZYRTEC) 10 MG tablet Take 10 mg by mouth daily.    . clindamycin (CLEOCIN-T) 1 % lotion Apply topically 2 (two) times daily. (Patient not taking: Reported on 06/13/2017) 60 mL 0  . cyclobenzaprine (FLEXERIL) 10 MG tablet Take 1 tablet (10 mg total) by mouth 3 (three) times daily as needed for muscle spasms. 30 tablet 0  . doxycycline (VIBRA-TABS) 100 MG tablet Take 1 tablet (100 mg total) by mouth 2 (two) times daily. (Patient not taking: Reported on 06/13/2017) 20 tablet 0   No current facility-administered medications on file prior to visit.     ROS ROS otherwise unremarkable  unless listed above.  Physical Examination: BP 122/84 (BP Location: Right Arm, Patient Position: Sitting, Cuff Size: Normal)   Pulse 98   Temp 97.9 F (36.6 C) (Oral)   Resp 18   Ht 5' 2.01" (1.575 m)   Wt 192 lb 12.8 oz (87.5 kg)   LMP 05/30/2017 (Approximate)   SpO2 98%   BMI 35.25 kg/m  Ideal Body Weight: Weight in (lb) to have BMI = 25: 136.4  Physical Exam  Constitutional: She is oriented to person, place, and time. She appears well-developed and well-nourished. No distress.  HENT:  Head: Normocephalic and atraumatic.  Right Ear: External ear normal.  Left Ear: External ear normal.  Eyes: Conjunctivae and EOM are normal. Pupils are equal, round, and reactive to light.  Cardiovascular: Normal rate.  Pulmonary/Chest: Effort normal. No  respiratory distress.  Musculoskeletal:       Lumbar back: She exhibits decreased range of motion, tenderness and bony tenderness (tender at the sacrum and just right musculature.). She exhibits no swelling and no spasm.  Neurological: She is alert and oriented to person, place, and time.  Skin: She is not diaphoretic.  Psychiatric: She has a normal mood and affect. Her behavior is normal.     Dg Lumbar Spine Complete  Result Date: 06/13/2017 CLINICAL DATA:  Low back pain with right-sided paresthesia for 2 weeks. EXAM: LUMBAR SPINE - COMPLETE 4+ VIEW COMPARISON:  None. FINDINGS: No evidence of fracture, bone lesion, or endplate erosion. No endplate degeneration or focal disc narrowing. Negative facets. IMPRESSION: Negative lumbar spine series. Electronically Signed   By: Marnee Spring M.D.   On: 06/13/2017 10:09   Dg Sacrum/coccyx  Result Date: 06/13/2017 CLINICAL DATA:  Low back pain with right-sided paresthesia. EXAM: SACRUM AND COCCYX - 2+ VIEW COMPARISON:  None. FINDINGS: There is no evidence of fracture or other focal bone lesions. IMPRESSION: Normal sacrum and coccyx. Electronically Signed   By: Lupita Raider, M.D.   On: 06/13/2017 10:11    Assessment and Plan: Terri Ross is a 27 y.o. female who is here today for cc of  Chief Complaint  Patient presents with  . Back Pain    X 1 1/2 lower back  . Medication Refill    pro air  piriformis vs sciatica.  Advised icing three times per day for 15 minutes.  She tolerates prednisone well, she reports. Stretching and icing regimen given for self PT. Alarming sxs advised to warrant immediate return.  Acute bilateral low back pain with right-sided sciatica - Plan: DG Lumbar Spine Complete, DG Sacrum/Coccyx, predniSONE (DELTASONE) 20 MG tablet, cyclobenzaprine (FLEXERIL) 10 MG tablet  Lumbar pain - Plan: DG Lumbar Spine Complete, DG Sacrum/Coccyx  Paresthesia - Plan: DG Lumbar Spine Complete, DG Sacrum/Coccyx  Mild persistent asthma  with exacerbation - Plan: albuterol (PROVENTIL HFA;VENTOLIN HFA) 108 (90 Base) MCG/ACT inhaler  Trena Platt, PA-C [facility] 2/19/20198:18 AM

## 2017-06-13 NOTE — Patient Instructions (Addendum)
Please ice the back three times per day for 15 minutes. I am going to give you a prednisone taper.  Take with breakfast; initially at your waking hours.   You can take ibuprofen for pain or fever, such as 600mg  every 8 hours as needed.   Please do not take the flexeril with operating heavy machinery.    Sciatica Sciatica is pain, numbness, weakness, or tingling along your sciatic nerve. The sciatic nerve starts in the lower back and goes down the back of each leg. Sciatica happens when this nerve is pinched or has pressure put on it. Sciatica usually goes away on its own or with treatment. Sometimes, sciatica may keep coming back (recur). Follow these instructions at home: Medicines  Take over-the-counter and prescription medicines only as told by your doctor.  Do not drive or use heavy machinery while taking prescription pain medicine. Managing pain  If directed, put ice on the affected area. ? Put ice in a plastic bag. ? Place a towel between your skin and the bag. ? Leave the ice on for 20 minutes, 2-3 times a day.  After icing, apply heat to the affected area before you exercise or as often as told by your doctor. Use the heat source that your doctor tells you to use, such as a moist heat pack or a heating pad. ? Place a towel between your skin and the heat source. ? Leave the heat on for 20-30 minutes. ? Remove the heat if your skin turns bright red. This is especially important if you are unable to feel pain, heat, or cold. You may have a greater risk of getting burned. Activity  Return to your normal activities as told by your doctor. Ask your doctor what activities are safe for you. ? Avoid activities that make your sciatica worse.  Take short rests during the day. Rest in a lying or standing position. This is usually better than sitting to rest. ? When you rest for a long time, do some physical activity or stretching between periods of rest. ? Avoid sitting for a long time  without moving. Get up and move around at least one time each hour.  Exercise and stretch regularly, as told by your doctor.  Do not lift anything that is heavier than 10 lb (4.5 kg) while you have symptoms of sciatica. ? Avoid lifting heavy things even when you do not have symptoms. ? Avoid lifting heavy things over and over.  When you lift objects, always lift in a way that is safe for your body. To do this, you should: ? Bend your knees. ? Keep the object close to your body. ? Avoid twisting. General instructions  Use good posture. ? Avoid leaning forward when you are sitting. ? Avoid hunching over when you are standing.  Stay at a healthy weight.  Wear comfortable shoes that support your feet. Avoid wearing high heels.  Avoid sleeping on a mattress that is too soft or too hard. You might have less pain if you sleep on a mattress that is firm enough to support your back.  Keep all follow-up visits as told by your doctor. This is important. Contact a doctor if:  You have pain that: ? Wakes you up when you are sleeping. ? Gets worse when you lie down. ? Is worse than the pain you have had in the past. ? Lasts longer than 4 weeks.  You lose weight for without trying. Get help right away if:  You  cannot control when you pee (urinate) or poop (have a bowel movement).  You have weakness in any of these areas and it gets worse. ? Lower back. ? Lower belly (pelvis). ? Butt (buttocks). ? Legs.  You have redness or swelling of your back.  You have a burning feeling when you pee. This information is not intended to replace advice given to you by your health care provider. Make sure you discuss any questions you have with your health care provider. Document Released: 02/01/2008 Document Revised: 09/30/2015 Document Reviewed: 01/01/2015 Elsevier Interactive Patient Education  2018 [employer].    IF you received an x-ray today, you will receive an invoice from [facility]  Radiology. Please contact [facility] Radiology at [phone] with questions or concerns regarding your invoice.   IF you received labwork today, you will receive an invoice from [place]. Please contact [facility] at [phone] with questions or concerns regarding your invoice.   Our billing staff will not be able to assist you with questions regarding bills from these companies.  You will be contacted with the lab results as soon as they are available. The fastest way to get your results is to activate your My Chart account. Instructions are located on the last page of this paperwork. If you have not heard from [place] regarding the results in 2 weeks, please contact this office.    e

## 2017-08-08 ENCOUNTER — Encounter: Payer: Self-pay | Admitting: Physician Assistant

## 2017-08-27 ENCOUNTER — Other Ambulatory Visit: Payer: Self-pay

## 2017-08-27 ENCOUNTER — Encounter: Payer: Self-pay | Admitting: Physician Assistant

## 2017-08-27 ENCOUNTER — Ambulatory Visit: Payer: BLUE CROSS/BLUE SHIELD | Admitting: Physician Assistant

## 2017-08-27 DIAGNOSIS — K0889 Other specified disorders of teeth and supporting structures: Secondary | ICD-10-CM | POA: Diagnosis not present

## 2017-08-27 MED ORDER — AMOXICILLIN-POT CLAVULANATE 875-125 MG PO TABS: 1.0000 | ORAL_TABLET | Freq: Two times a day (BID) | ORAL | 0 refills | Status: AC

## 2017-08-27 NOTE — Patient Instructions (Addendum)
   Dental Pain Dental pain may be caused by many things, including:  Tooth decay (cavities or caries). Cavities cause the nerve of your tooth to be open to air and hot or cold temperatures. This can cause pain or discomfort.  Abscess or infection. A dental abscess is an area that is full of infected pus from a bacterial infection in the inner part of the tooth (pulp). It usually happens at the end of the tooth's root.  Injury.  An unknown reason (idiopathic).  Your pain may be mild or severe. It may only happen when:  You are chewing.  You are exposed to hot or cold temperature.  You are eating or drinking sugary foods or beverages, such as: ? Soda. ? Candy.  Your pain may also be there all of the time. Follow these instructions at home: Watch your dental pain for any changes. Do these things to lessen your discomfort:  Take medicines only as told by your dentist.  If your dentist tells you to take an antibiotic medicine, finish all of it even if you start to feel better.  Keep all follow-up visits as told by your dentist. This is important.  Do not apply heat to the outside of your face.  Rinse your mouth or gargle with salt water if told by your dentist. This helps with pain and swelling. ? You can make salt water by adding  tsp of salt to 1 cup of warm water.  Apply ice to the painful area of your face: ? Put ice in a plastic bag. ? Place a towel between your skin and the bag. ? Leave the ice on for 20 minutes, 2-3 times per day.  Avoid foods or drinks that cause you pain, such as: ? Very hot or very cold foods or drinks. ? Sweet or sugary foods or drinks.  Contact a doctor if:  Your pain is not helped with medicines.  Your symptoms are worse.  You have new symptoms. Get help right away if:  You cannot open your mouth.  You are having trouble breathing or swallowing.  You have a fever.  Your face, neck, or jaw is puffy (swollen). This information is  not intended to replace advice given to you by your health care provider. Make sure you discuss any questions you have with your health care provider. Document Released: 10/11/2007 Document Revised: 09/30/2015 Document Reviewed: 04/20/2014 Elsevier Interactive Patient Education  2018 [employer].    IF you received an x-ray today, you will receive an invoice from [facility] Radiology. Please contact [facility] Radiology at [phone] with questions or concerns regarding your invoice.   IF you received labwork today, you will receive an invoice from [place]. Please contact [facility] at [phone] with questions or concerns regarding your invoice.   Our billing staff will not be able to assist you with questions regarding bills from these companies.  You will be contacted with the lab results as soon as they are available. The fastest way to get your results is to activate your My Chart account. Instructions are located on the last page of this paperwork. If you have not heard from [place] regarding the results in 2 weeks, please contact this office.

## 2017-08-27 NOTE — Progress Notes (Signed)
PRIMARY CARE AT [facility] [address] [place] [address] [phone]  Date:  08/27/2017   Name:  Terri Ross   DOB:  [birth year]   MRN:  [id number]  PCP:  Garnetta Buddy, PA    History of Present Illness:  Terri Ross is a 27 y.o. female patient who presents to PCP with  Chief Complaint  Patient presents with  . Dental Pain    x 3 days/ pt dose not have dental insurance     3 days of pain of upper right back tooth.  There is swelling of the area in the last 2 days, but this has improved.   No nasal congestion or ear pain.   She has had no fever. No drainage to the tooth. She has tried multiple cleanings and washing.  She has no dental insurance and is holding off with going to dentist.   Patient Active Problem List   Diagnosis Date Noted  . Rheumatoid arthritis (HCC) 07/28/2016  . Chronic tension-type headache, not intractable 07/26/2016  . Depression 02/04/2016  . GAD (generalized anxiety disorder) 02/04/2016  . Mild intermittent asthma 07/16/2013    Past Medical History:  Diagnosis Date  . Anxiety   . Arthritis   . Asthma   . Rheumatoid arthritis [facility])     Past Surgical History:  Procedure Laterality Date  . NO PAST SURGERIES      Social History   Tobacco Use  . Smoking status: Never Smoker  . Smokeless tobacco: Never Used  Substance Use Topics  . Alcohol use: No  . Drug use: No    Family History  Problem Relation Age of Onset  . Hypertension Father   . Anxiety disorder Father   . Asthma Sister   . Arthritis Sister   . Schizophrenia Sister   . Cancer Maternal Grandmother   . Cancer Paternal Grandmother        lung, POSITIVE TOBACCO    Allergies  Allergen Reactions  . Voltaren [Diclofenac Sodium] Shortness Of Breath  . Doxycycline   . Lamotrigine Rash    Medication list has been reviewed and updated.  Current Outpatient Medications on File Prior to Visit  Medication Sig Dispense Refill  . albuterol (PROVENTIL HFA;VENTOLIN  HFA) 108 (90 Base) MCG/ACT inhaler INHALE 2 PUFFS EVERY 6 HOURS AS NEEDED FOR WHEEZING OR SHORTNESS OF BREATH 8.5 Inhaler 6  . budesonide-formoterol (SYMBICORT) 80-4.5 MCG/ACT inhaler Inhale 2 puffs into the lungs 2 (two) times daily. 1 Inhaler 5  . busPIRone (BUSPAR) 15 MG tablet Take 1 tablet (15 mg total) by mouth 3 (three) times daily. 90 tablet 2  . cetirizine (ZYRTEC) 10 MG tablet Take 10 mg by mouth daily.    . cyclobenzaprine (FLEXERIL) 10 MG tablet Take 0.5-1 tablets (5-10 mg total) by mouth 3 (three) times daily as needed. 30 tablet 0   No current facility-administered medications on file prior to visit.     ROS ROS otherwise unremarkable unless listed above.  Physical Examination: There were no vitals taken for this visit. Ideal Body Weight:    Physical Exam  Constitutional: She is oriented to person, place, and time. She appears well-developed and well-nourished. No distress.  HENT:  Head: Normocephalic and atraumatic.  Right Ear: Tympanic membrane, external ear and ear canal normal.  Left Ear: Tympanic membrane, external ear and ear canal normal.  Nose: No mucosal edema or rhinorrhea. Right sinus exhibits no maxillary sinus tenderness and no frontal sinus tenderness. Left sinus exhibits no maxillary  sinus tenderness and no frontal sinus tenderness.  Mouth/Throat: No uvula swelling. No oropharyngeal exudate, posterior oropharyngeal edema or posterior oropharyngeal erythema.    Eyes: Pupils are equal, round, and reactive to light. Conjunctivae and EOM are normal.  Cardiovascular: Normal rate and regular rhythm. Exam reveals no gallop, no distant heart sounds and no friction rub.  No murmur heard. Pulmonary/Chest: Effort normal. No respiratory distress. She has no decreased breath sounds. She has no wheezes. She has no rhonchi.  Lymphadenopathy:       Head (right side): No submandibular, no tonsillar, no preauricular and no posterior auricular adenopathy present.       Head  (left side): No submandibular, no tonsillar, no preauricular and no posterior auricular adenopathy present.  Neurological: She is alert and oriented to person, place, and time.  Skin: She is not diaphoretic.  Psychiatric: She has a normal mood and affect. Her behavior is normal.     Assessment and Plan: Terri Ross is a 27 y.o. female who is here today for cc of  Chief Complaint  Patient presents with  . Dental Pain    x 3 days/ pt dose not have dental insurance   Will treat with abx at this time. Alarming symptoms advised to warrant immediate return.  Pain, dental - Plan: amoxicillin-clavulanate (AUGMENTIN) 875-125 MG tablet  Trena Platt, PA-C [facility] 4/23/201910:06 AM

## 2017-10-16 ENCOUNTER — Other Ambulatory Visit: Payer: Self-pay

## 2017-10-16 ENCOUNTER — Ambulatory Visit: Payer: BLUE CROSS/BLUE SHIELD | Admitting: Physician Assistant

## 2017-10-16 ENCOUNTER — Encounter: Payer: Self-pay | Admitting: Physician Assistant

## 2017-10-16 VITALS — BP 118/84 | HR 91 | Temp 98.0°F | Resp 20 | Ht 62.72 in | Wt 191.2 lb

## 2017-10-16 DIAGNOSIS — Z889 Allergy status to unspecified drugs, medicaments and biological substances status: Secondary | ICD-10-CM

## 2017-10-16 DIAGNOSIS — G8929 Other chronic pain: Secondary | ICD-10-CM

## 2017-10-16 DIAGNOSIS — Z9189 Other specified personal risk factors, not elsewhere classified: Secondary | ICD-10-CM

## 2017-10-16 DIAGNOSIS — M25561 Pain in right knee: Secondary | ICD-10-CM

## 2017-10-16 MED ORDER — MONTELUKAST SODIUM 10 MG PO TABS: 10.0000 mg | ORAL_TABLET | Freq: Every day | ORAL | 3 refills | Status: DC

## 2017-10-16 MED ORDER — DICLOFENAC SODIUM 1 % TD GEL: 4.0000 g | Freq: Four times a day (QID) | TRANSDERMAL | 0 refills | Status: DC
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
## 2017-10-16 NOTE — Patient Instructions (Addendum)
  You will receive a phone call to schedule an appointment with sports medicine for your knee.  Apply voltaren gel to your knee 1-2 times/daily.  Try using an ace wrap every day - see if this helps.    Thank you for coming in today. I hope you feel we met your needs.  Feel free to call PCP if you have any questions or further requests.  Please consider signing up for MyChart if you do not already have it, as this is a great way to communicate with me.  Best,  Whitney McVey, PA-C   IF you received an x-ray today, you will receive an invoice from [facility] Radiology. Please contact [facility] Radiology at [phone] with questions or concerns regarding your invoice.   IF you received labwork today, you will receive an invoice from [place]. Please contact [facility] at [phone] with questions or concerns regarding your invoice.   Our billing staff will not be able to assist you with questions regarding bills from these companies.  You will be contacted with the lab results as soon as they are available. The fastest way to get your results is to activate your My Chart account. Instructions are located on the last page of this paperwork. If you have not heard from [place] regarding the results in 2 weeks, please contact this office.

## 2017-10-19 ENCOUNTER — Telehealth: Payer: Self-pay | Admitting: Physician Assistant

## 2017-10-19 NOTE — Telephone Encounter (Signed)
Copied from CRM [id number]. Topic: Quick Communication - Rx Refill/Question >> Oct 19, 2017  2:45 PM Alexander Bergeron B wrote: Medication: diclofenac sodium (VOLTAREN) 1 % GEL [[id number]]   Pt was told by her pharmacy that her pcp was needing to contact pt's insurance ([employer]) in order to get the medication above filled; call pt to advise b/c a number was not left; pt did state that the pharmacy was suppose to send a fax w/ the information over

## 2017-10-21 NOTE — Telephone Encounter (Signed)
Message to Pixley ? PA

## 2017-10-22 NOTE — Telephone Encounter (Signed)
Pa was denied for voltaren

## 2017-10-22 NOTE — Telephone Encounter (Signed)
Note sent to [facility] voltaren was denied

## 2017-10-23 ENCOUNTER — Other Ambulatory Visit: Payer: Self-pay | Admitting: Physician Assistant

## 2017-10-23 DIAGNOSIS — M25561 Pain in right knee: Secondary | ICD-10-CM

## 2017-10-23 DIAGNOSIS — G8929 Other chronic pain: Secondary | ICD-10-CM

## 2017-10-23 DIAGNOSIS — M25562 Pain in left knee: Principal | ICD-10-CM

## 2017-10-23 MED ORDER — TROLAMINE SALICYLATE 10 % EX CREA: 1.0000 "application " | TOPICAL_CREAM | CUTANEOUS | 0 refills | Status: DC | PRN

## 2017-10-23 NOTE — Telephone Encounter (Signed)
Thank you. Rx for aspercream sent to pharmacy.

## 2017-10-25 DIAGNOSIS — M25561 Pain in right knee: Secondary | ICD-10-CM | POA: Diagnosis not present

## 2017-11-14 ENCOUNTER — Encounter: Payer: Self-pay | Admitting: Physician Assistant

## 2017-11-14 ENCOUNTER — Other Ambulatory Visit: Payer: Self-pay

## 2017-11-14 ENCOUNTER — Ambulatory Visit: Payer: BLUE CROSS/BLUE SHIELD | Admitting: Physician Assistant

## 2017-11-14 VITALS — BP 110/86 | HR 100 | Temp 98.3°F | Resp 16 | Ht 62.75 in | Wt 194.2 lb

## 2017-11-14 DIAGNOSIS — J4531 Mild persistent asthma with (acute) exacerbation: Secondary | ICD-10-CM | POA: Diagnosis not present

## 2017-11-14 DIAGNOSIS — F411 Generalized anxiety disorder: Secondary | ICD-10-CM

## 2017-11-14 MED ORDER — ALBUTEROL SULFATE HFA 108 (90 BASE) MCG/ACT IN AERS: INHALATION_SPRAY | RESPIRATORY_TRACT | 6 refills | Status: DC

## 2017-11-14 MED ORDER — CITALOPRAM HYDROBROMIDE 10 MG PO TABS: 10.0000 mg | ORAL_TABLET | Freq: Every day | ORAL | 1 refills | Status: DC

## 2017-11-14 MED ORDER — BUDESONIDE-FORMOTEROL FUMARATE 80-4.5 MCG/ACT IN AERO: 2.0000 | INHALATION_SPRAY | Freq: Two times a day (BID) | RESPIRATORY_TRACT | 5 refills | Status: DC

## 2017-11-14 MED ORDER — HYDROXYZINE HCL 50 MG PO TABS: 50.0000 mg | ORAL_TABLET | Freq: Three times a day (TID) | ORAL | 1 refills | Status: DC | PRN

## 2017-11-14 NOTE — Progress Notes (Signed)
Terri Ross  MRN: [id number] DOB: 10-Mar-1991  PCP: Sebastian Ache, PA-C  Subjective:  Pt is a 27 year old who presents to clinic for medication refill.   Mild persistent asthma - needs refill of Albuterol. She uses 1 puff twice daily. She is using Singulair once daily.   Anxiety - Buspar 15mg  PRN. She was taking this once daily. Stopped taking this last week bc it stopped working and made her dizzy.  She has tried Celexa for depression.   Review of Systems  Neurological: Positive for dizziness. Negative for light-headedness and headaches.  Psychiatric/Behavioral: Negative for self-injury and suicidal ideas. The patient is nervous/anxious.     Patient Active Problem List   Diagnosis Date Noted  . Rheumatoid arthritis (HCC) 07/28/2016  . Chronic tension-type headache, not intractable 07/26/2016  . Depression 02/04/2016  . GAD (generalized anxiety disorder) 02/04/2016  . Mild intermittent asthma 07/16/2013    Current Outpatient Medications on File Prior to Visit  Medication Sig Dispense Refill  . albuterol (PROVENTIL HFA;VENTOLIN HFA) 108 (90 Base) MCG/ACT inhaler INHALE 2 PUFFS EVERY 6 HOURS AS NEEDED FOR WHEEZING OR SHORTNESS OF BREATH 8.5 Inhaler 6  . busPIRone (BUSPAR) 15 MG tablet Take 1 tablet (15 mg total) by mouth 3 (three) times daily. 90 tablet 2  . cetirizine (ZYRTEC) 10 MG tablet Take 10 mg by mouth daily.    . cyclobenzaprine (FLEXERIL) 10 MG tablet Take 0.5-1 tablets (5-10 mg total) by mouth 3 (three) times daily as needed. 30 tablet 0  . montelukast (SINGULAIR) 10 MG tablet Take 1 tablet (10 mg total) by mouth at bedtime. 30 tablet 3  . budesonide-formoterol (SYMBICORT) 80-4.5 MCG/ACT inhaler Inhale 2 puffs into the lungs 2 (two) times daily. (Patient not taking: Reported on 10/16/2017) 1 Inhaler 5  . diclofenac sodium (VOLTAREN) 1 % GEL Apply 4 g topically 4 (four) times daily. (Patient not taking: Reported on 11/14/2017) 100 g 0  . trolamine salicylate  (ASPERCREME/ALOE) 10 % cream Apply 1 application topically as needed for muscle pain. (Patient not taking: Reported on 11/14/2017) 85 g 0   No current facility-administered medications on file prior to visit.     Allergies  Allergen Reactions  . Voltaren [Diclofenac Sodium] Shortness Of Breath  . Doxycycline   . Lamotrigine Rash     Objective:  BP 110/86 (BP Location: Left Arm, Patient Position: Sitting, Cuff Size: Normal)   Pulse (!) 113   Temp 98.3 F (36.8 C) (Oral)   Resp 16   Ht 5' 2.75" (1.594 m)   Wt 194 lb 3.2 oz (88.1 kg)   LMP 11/09/2017   SpO2 99%   BMI 34.68 kg/m   Physical Exam  Constitutional: She is oriented to person, place, and time. No distress.  Cardiovascular: Normal rate, regular rhythm and normal heart sounds.  Neurological: She is alert and oriented to person, place, and time.  Skin: Skin is warm and dry.  Psychiatric: Her speech is normal. Judgment normal. Her mood appears anxious. Cognition and memory are normal.  Vitals reviewed.   Assessment and Plan :  1. Generalized anxiety disorder - Buspar is making her dizzy. D/c buspar, start Celexa 10mg . Titrate up to 20mg  qd if needed. RTC in 4-6 weeks to recheck.  - citalopram (CELEXA) 10 MG tablet; Take 1 tablet (10 mg total) by mouth daily. After 3 weeks you may increase to 20mg  daily if needed.  Dispense: 60 tablet; Refill: 1  2. Mild persistent asthma with exacerbation - albuterol (  PROVENTIL HFA;VENTOLIN HFA) 108 (90 Base) MCG/ACT inhaler; INHALE 2 PUFFS EVERY 6 HOURS AS NEEDED FOR WHEEZING OR SHORTNESS OF BREATH  Dispense: 8.5 Inhaler; Refill: 6 - budesonide-formoterol (SYMBICORT) 80-4.5 MCG/ACT inhaler; Inhale 2 puffs into the lungs 2 (two) times daily.  Dispense: 1 Inhaler; Refill: 5   Whitney Timera Windt, PA-C  [facility] at [facility] 11/14/2017 5:59 PM

## 2017-11-14 NOTE — Patient Instructions (Addendum)
You should not use Albuterol daily - this is a rescue inhaler and should only be used during emergencies. If you use this daily, it has the potential of not working when you *really* need it. This is a short-acting medication  Start using symbicort daily: Inhale 2 puffs into the lungs 2 (two) times daily. This is a long-acting medication.   Start taking Celexa 10 mg once daily. If you are not feeling improvement after 3 weeks you can increase the dose to 20 mg/daily. Come back and see me in 4-6 weeks to recheck how you are feeling. We can continue to increase the dose if needed.  Hydroxyzine if for breakthrough help for anxiety. Take 50-100 mg as needed. This may make you sleepy.    Thank you for coming in today. I hope you feel we met your needs.  Feel free to call PCP if you have any questions or further requests.  Please consider signing up for MyChart if you do not already have it, as this is a great way to communicate with me.  Best,  Whitney McVey, PA-C  IF you received an x-ray today, you will receive an invoice from [facility]. Please contact [facility] at [phone] with questions or concerns regarding your invoice.   IF you received labwork today, you will receive an invoice from [facility]. Please contact [facility] at [phone] with questions or concerns regarding your invoice.   Our billing staff will not be able to assist you with questions regarding bills from these companies.  You will be contacted with the lab results as soon as they are available. The fastest way to get your results is to activate your My Chart account. Instructions are located on the last page of this paperwork. If you have not heard from us regarding the results in 2 weeks, please contact this office.     

## 2017-12-21 ENCOUNTER — Ambulatory Visit: Payer: BLUE CROSS/BLUE SHIELD | Admitting: Physician Assistant

## 2017-12-25 ENCOUNTER — Ambulatory Visit (INDEPENDENT_AMBULATORY_CARE_PROVIDER_SITE_OTHER): Payer: BLUE CROSS/BLUE SHIELD | Admitting: Physician Assistant

## 2017-12-25 ENCOUNTER — Encounter: Payer: Self-pay | Admitting: Physician Assistant

## 2017-12-25 VITALS — BP 120/80 | HR 74 | Temp 98.2°F | Resp 16 | Ht 63.0 in | Wt 194.0 lb

## 2017-12-25 DIAGNOSIS — F411 Generalized anxiety disorder: Secondary | ICD-10-CM

## 2017-12-25 MED ORDER — ESCITALOPRAM OXALATE 10 MG PO TABS: 10.0000 mg | ORAL_TABLET | Freq: Every day | ORAL | 2 refills | Status: DC

## 2017-12-25 NOTE — Progress Notes (Signed)
Terri Ross  MRN: [id number] DOB: 11-16-1990  PCP: Sebastian Ache, PA-C  Subjective:  Pt is a 27 year old female who presents to clinic for muscle twitching x 2 weeks. Happens on both sides of her body R>L  Pt feels it is a combination of taking Celexa and Hydroxyzine Twitching started 2 weeks ago.   Celexa 10mg  qd. She is taking 20mg  qd.  Hydroxyzine 50-100mg  PRN for breakthrough anxiety. Once daily. Makes her sleepy. Takes this to help her sleep.   Review of Systems  Gastrointestinal: Negative for nausea and vomiting.  Neurological: Positive for tremors. Negative for dizziness and headaches.  Psychiatric/Behavioral: Positive for dysphoric mood. Negative for self-injury and suicidal ideas. The patient is nervous/anxious.     Patient Active Problem List   Diagnosis Date Noted  . Rheumatoid arthritis (HCC) 07/28/2016  . Chronic tension-type headache, not intractable 07/26/2016  . Depression 02/04/2016  . GAD (generalized anxiety disorder) 02/04/2016  . Mild intermittent asthma 07/16/2013    Current Outpatient Medications on File Prior to Visit  Medication Sig Dispense Refill  . albuterol (PROVENTIL HFA;VENTOLIN HFA) 108 (90 Base) MCG/ACT inhaler INHALE 2 PUFFS EVERY 6 HOURS AS NEEDED FOR WHEEZING OR SHORTNESS OF BREATH 8.5 Inhaler 6  . budesonide-formoterol (SYMBICORT) 80-4.5 MCG/ACT inhaler Inhale 2 puffs into the lungs 2 (two) times daily. 1 Inhaler 5  . busPIRone (BUSPAR) 15 MG tablet Take 1 tablet (15 mg total) by mouth 3 (three) times daily. 90 tablet 2  . citalopram (CELEXA) 10 MG tablet Take 1 tablet (10 mg total) by mouth daily. After 3 weeks you may increase to 20mg  daily if needed. 60 tablet 1  . cyclobenzaprine (FLEXERIL) 10 MG tablet Take 0.5-1 tablets (5-10 mg total) by mouth 3 (three) times daily as needed. 30 tablet 0  . hydrOXYzine (ATARAX/VISTARIL) 50 MG tablet Take 1-2 tablets (50-100 mg total) by mouth every 8 (eight) hours as needed for anxiety.  60 tablet 1  . montelukast (SINGULAIR) 10 MG tablet Take 1 tablet (10 mg total) by mouth at bedtime. 30 tablet 3  . cetirizine (ZYRTEC) 10 MG tablet Take 10 mg by mouth daily.    . diclofenac sodium (VOLTAREN) 1 % GEL Apply 4 g topically 4 (four) times daily. (Patient not taking: Reported on 11/14/2017) 100 g 0   No current facility-administered medications on file prior to visit.     Allergies  Allergen Reactions  . Voltaren [Diclofenac Sodium] Shortness Of Breath  . Doxycycline   . Lamotrigine Rash     Objective:  BP 120/80 (BP Location: Left Arm, Patient Position: Sitting, Cuff Size: Normal)   Pulse 74   Temp 98.2 F (36.8 C) (Oral)   Resp 16   Ht 5\' 3"  (1.6 m)   Wt 194 lb (88 kg)   LMP 12/16/2017   SpO2 97%   BMI 34.37 kg/m   Physical Exam  Constitutional: She is oriented to person, place, and time. No distress.  Cardiovascular: Normal rate, regular rhythm and normal heart sounds.  Neurological: She is alert and oriented to person, place, and time.  Skin: Skin is warm and dry.  Psychiatric: Judgment normal.  Vitals reviewed.   Assessment and Plan :  1. GAD (generalized anxiety disorder) - Celexa side effect of muscle twitching. Will switch to Lexapro 10mg , increase to 20mg  if needed after 3 weeks. RTC in 4-6 weks to check.  - escitalopram (LEXAPRO) 10 MG tablet; Take 1 tablet (10 mg total) by mouth daily. After 3  weeks increase to 20mg  if needed.  Dispense: 45 tablet; Refill: 2   , PA-C  [facility] at [facility] 12/25/2017 3:53 PM  Please note: Portions of this report may have been transcribed using dragon voice recognition software. Every effort was made to ensure accuracy; however, inadvertent computerized transcription errors may be present.

## 2017-12-25 NOTE — Patient Instructions (Addendum)
Stop taking Celexa.   Lexapro: Initial: 10 mg once daily; dose may be increased after 2-3 week based on response and tolerability to a maximum of 20 mg once daily.  Continue taking hydroxyzine as needed.   Escitalopram tablets What is this medicine? ESCITALOPRAM (es sye TAL oh pram) is used to treat depression and certain types of anxiety. This medicine may be used for other purposes; ask your health care provider or pharmacist if you have questions. COMMON BRAND NAME(S): Lexapro What should I tell my health care provider before I take this medicine? They need to know if you have any of these conditions: -bipolar disorder or a family history of bipolar disorder -diabetes -glaucoma -heart disease -kidney or liver disease -receiving electroconvulsive therapy -seizures (convulsions) -suicidal thoughts, plans, or attempt by you or a family member -an unusual or allergic reaction to escitalopram, the related drug citalopram, other medicines, foods, dyes, or preservatives -pregnant or trying to become pregnant -breast-feeding How should I use this medicine? Take this medicine by mouth with a glass of water. Follow the directions on the prescription label. You can take it with or without food. If it upsets your stomach, take it with food. Take your medicine at regular intervals. Do not take it more often than directed. Do not stop taking this medicine suddenly except upon the advice of your doctor. Stopping this medicine too quickly may cause serious side effects or your condition may worsen. A special MedGuide will be given to you by the pharmacist with each prescription and refill. Be sure to read this information carefully each time. Talk to your pediatrician regarding the use of this medicine in children. Special care may be needed. Overdosage: If you think you have taken too much of this medicine contact a poison control center or emergency room at once. NOTE: This medicine is only for  you. Do not share this medicine with others. What if I miss a dose? If you miss a dose, take it as soon as you can. If it is almost time for your next dose, take only that dose. Do not take double or extra doses. What may interact with this medicine? Do not take this medicine with any of the following medications: -certain medicines for fungal infections like fluconazole, itraconazole, ketoconazole, posaconazole, voriconazole -cisapride -citalopram -dofetilide -dronedarone -linezolid -MAOIs like Carbex, Eldepryl, Marplan, Nardil, and Parnate -methylene blue (injected into a vein) -pimozide -thioridazine -ziprasidone This medicine may also interact with the following medications: -alcohol -amphetamines -aspirin and aspirin-like medicines -carbamazepine -certain medicines for depression, anxiety, or psychotic disturbances -certain medicines for migraine headache like almotriptan, eletriptan, frovatriptan, naratriptan, rizatriptan, sumatriptan, zolmitriptan -certain medicines for sleep -certain medicines that treat or prevent blood clots like warfarin, enoxaparin, dalteparin -cimetidine -diuretics -fentanyl -furazolidone -isoniazid -lithium -metoprolol -NSAIDs, medicines for pain and inflammation, like ibuprofen or naproxen -other medicines that prolong the QT interval (cause an abnormal heart rhythm) -procarbazine -rasagiline -supplements like St. John's wort, kava kava, valerian -tramadol -tryptophan This list may not describe all possible interactions. Give your health care provider a list of all the medicines, herbs, non-prescription drugs, or dietary supplements you use. Also tell them if you smoke, drink alcohol, or use illegal drugs. Some items may interact with your medicine. What should I watch for while using this medicine? Tell your doctor if your symptoms do not get better or if they get worse. Visit your doctor or health care professional for regular checks on your  progress. Because it may take several weeks to  see the full effects of this medicine, it is important to continue your treatment as prescribed by your doctor. Patients and their families should watch out for new or worsening thoughts of suicide or depression. Also watch out for sudden changes in feelings such as feeling anxious, agitated, panicky, irritable, hostile, aggressive, impulsive, severely restless, overly excited and hyperactive, or not being able to sleep. If this happens, especially at the beginning of treatment or after a change in dose, call your health care professional. Dennis Bast may get drowsy or dizzy. Do not drive, use machinery, or do anything that needs mental alertness until you know how this medicine affects you. Do not stand or sit up quickly, especially if you are an older patient. This reduces the risk of dizzy or fainting spells. Alcohol may interfere with the effect of this medicine. Avoid alcoholic drinks. Your mouth may get dry. Chewing sugarless gum or sucking hard candy, and drinking plenty of water may help. Contact your doctor if the problem does not go away or is severe. What side effects may I notice from receiving this medicine? Side effects that you should report to your doctor or health care professional as soon as possible: -allergic reactions like skin rash, itching or hives, swelling of the face, lips, or tongue -anxious -black, tarry stools -changes in vision -confusion -elevated mood, decreased need for sleep, racing thoughts, impulsive behavior -eye pain -fast, irregular heartbeat -feeling faint or lightheaded, falls -feeling agitated, angry, or irritable -hallucination, loss of contact with reality -loss of balance or coordination -loss of memory -painful or prolonged erections -restlessness, pacing, inability to keep still -seizures -stiff muscles -suicidal thoughts or other mood changes -trouble sleeping -unusual bleeding or bruising -unusually weak  or tired -vomiting Side effects that usually do not require medical attention (report to your doctor or health care professional if they continue or are bothersome): -changes in appetite -change in sex drive or performance -headache -increased sweating -indigestion, nausea -tremors This list may not describe all possible side effects. Call your doctor for medical advice about side effects. You may report side effects to FDA at 1-800-FDA-1088. Where should I keep my medicine? Keep out of reach of children. Store at room temperature between 15 and 30 degrees C (59 and 86 degrees F). Throw away any unused medicine after the expiration date. NOTE: This sheet is a summary. It may not cover all possible information. If you have questions about this medicine, talk to your doctor, pharmacist, or health care provider.  2018 Elsevier/Gold Standard (2015-09-27 13:20:23)  Thank you for coming in today. I hope you feel we met your needs.  Feel free to call PCP if you have any questions or further requests.  Please consider signing up for MyChart if you do not already have it, as this is a great way to communicate with me.  Best,  Whitney McVey, PA-C  IF you received an x-ray today, you will receive an invoice from [facility]. Please contact [facility] at [phone] with questions or concerns regarding your invoice.   IF you received labwork today, you will receive an invoice from [facility]. Please contact [facility] at [phone] with questions or concerns regarding your invoice.   Our billing staff will not be able to assist you with questions regarding bills from these companies.  You will be contacted with the lab results as soon as they are available. The fastest way to get your results is to activate your My Chart account. Instructions are located on the  last page of this paperwork. If you have not heard from [place] regarding the results in 2 weeks, please contact this  office.

## 2018-01-11 ENCOUNTER — Telehealth: Payer: Self-pay | Admitting: Physician Assistant

## 2018-01-11 NOTE — Telephone Encounter (Signed)
Left a VM in regards to her appt she has with McVey on 02/06/2018. The provider will be out of the office and needs to be rescheduled.

## 2018-01-15 ENCOUNTER — Encounter: Payer: Self-pay | Admitting: Physician Assistant

## 2018-01-15 ENCOUNTER — Ambulatory Visit (INDEPENDENT_AMBULATORY_CARE_PROVIDER_SITE_OTHER): Payer: BLUE CROSS/BLUE SHIELD | Admitting: Physician Assistant

## 2018-01-15 ENCOUNTER — Other Ambulatory Visit: Payer: Self-pay

## 2018-01-15 VITALS — BP 124/90 | HR 99 | Temp 99.0°F | Resp 16 | Ht 62.5 in | Wt 193.2 lb

## 2018-01-15 DIAGNOSIS — K047 Periapical abscess without sinus: Secondary | ICD-10-CM

## 2018-01-15 DIAGNOSIS — K0889 Other specified disorders of teeth and supporting structures: Secondary | ICD-10-CM

## 2018-01-15 MED ORDER — TRAMADOL HCL 50 MG PO TABS: 50.0000 mg | ORAL_TABLET | Freq: Three times a day (TID) | ORAL | 0 refills | Status: AC | PRN

## 2018-01-15 MED ORDER — AMOXICILLIN-POT CLAVULANATE 875-125 MG PO TABS: 1.0000 | ORAL_TABLET | Freq: Two times a day (BID) | ORAL | 0 refills | Status: DC

## 2018-01-15 MED ORDER — ACETAMINOPHEN-CODEINE #3 300-30 MG PO TABS: 1.0000 | ORAL_TABLET | Freq: Every evening | ORAL | 0 refills | Status: DC | PRN

## 2018-01-15 NOTE — Patient Instructions (Addendum)
Start taking Augmentin for your infections. Take the ENTIRE COURSE! Even if you start to feel better sooner.  Tylenol #3 for pain to help you sleep. Do NOT take more tylenol while taking this medication. This medication has codeine in it. It may make you drowsy.  Ultram is for pain during the day. This may make you drowsy.  Do not take tylenol #3 and ultram together.   Tramadol tablets What is this medicine? TRAMADOL (TRA ma dole) is a pain reliever. It is used to treat moderate to severe pain in adults. This medicine may be used for other purposes; ask your health care provider or pharmacist if you have questions. COMMON BRAND NAME(S): Ultram What should I tell my health care provider before I take this medicine? They need to know if you have any of these conditions: -brain tumor -depression -drug abuse or addiction -head injury -if you frequently drink alcohol containing drinks -kidney disease or trouble passing urine -liver disease -lung disease, asthma, or breathing problems -seizures or epilepsy -suicidal thoughts, plans, or attempt; a previous suicide attempt by you or a family member -an unusual or allergic reaction to tramadol, codeine, other medicines, foods, dyes, or preservatives -pregnant or trying to get pregnant -breast-feeding How should I use this medicine? Take this medicine by mouth with a full glass of water. Follow the directions on the prescription label. You can take it with or without food. If it upsets your stomach, take it with food. Do not take your medicine more often than directed. A special MedGuide will be given to you by the pharmacist with each prescription and refill. Be sure to read this information carefully each time. Talk to your pediatrician regarding the use of this medicine in children. Special care may be needed. Overdosage: If you think you have taken too much of this medicine contact a poison control center or emergency room at once. NOTE:  This medicine is only for you. Do not share this medicine with others. What if I miss a dose? If you miss a dose, take it as soon as you can. If it is almost time for your next dose, take only that dose. Do not take double or extra doses. What may interact with this medicine? Do not take this medication with any of the following medicines: -MAOIs like Carbex, Eldepryl, Marplan, Nardil, and Parnate This medicine may also interact with the following medications: -alcohol -antihistamines for allergy, cough and cold -certain medicines for anxiety or sleep -certain medicines for depression like amitriptyline, fluoxetine, sertraline -certain medicines for migraine headache like almotriptan, eletriptan, frovatriptan, naratriptan, rizatriptan, sumatriptan, zolmitriptan -certain medicines for seizures like carbamazepine, oxcarbazepine, phenobarbital, primidone -certain medicines that treat or prevent blood clots like warfarin -digoxin -furazolidone -general anesthetics like halothane, isoflurane, methoxyflurane, propofol -linezolid -local anesthetics like lidocaine, pramoxine, tetracaine -medicines that relax muscles for surgery -other narcotic medicines for pain or cough -phenothiazines like chlorpromazine, mesoridazine, prochlorperazine, thioridazine -procarbazine This list may not describe all possible interactions. Give your health care provider a list of all the medicines, herbs, non-prescription drugs, or dietary supplements you use. Also tell them if you smoke, drink alcohol, or use illegal drugs. Some items may interact with your medicine. What should I watch for while using this medicine? Tell your doctor or health care professional if your pain does not go away, if it gets worse, or if you have new or a different type of pain. You may develop tolerance to the medicine. Tolerance means that you will need a  higher dose of the medicine for pain relief. Tolerance is normal and is expected if  you take this medicine for a long time. Do not suddenly stop taking your medicine because you may develop a severe reaction. Your body becomes used to the medicine. This does NOT mean you are addicted. Addiction is a behavior related to getting and using a drug for a non-medical reason. If you have pain, you have a medical reason to take pain medicine. Your doctor will tell you how much medicine to take. If your doctor wants you to stop the medicine, the dose will be slowly lowered over time to avoid any side effects. There are different types of narcotic medicines (opiates). If you take more than one type at the same time or if you are taking another medicine that also causes drowsiness, you may have more side effects. Give your health care provider a list of all medicines you use. Your doctor will tell you how much medicine to take. Do not take more medicine than directed. Call emergency for help if you have problems breathing or unusual sleepiness. You may get drowsy or dizzy. Do not drive, use machinery, or do anything that needs mental alertness until you know how this medicine affects you. Do not stand or sit up quickly, especially if you are an older patient. This reduces the risk of dizzy or fainting spells. Alcohol can increase or decrease the effects of this medicine. Avoid alcoholic drinks. You may have constipation. Try to have a bowel movement at least every 2 to 3 days. If you do not have a bowel movement for 3 days, call your doctor or health care professional. Your mouth may get dry. Chewing sugarless gum or sucking hard candy, and drinking plenty of water may help. Contact your doctor if the problem does not go away or is severe. What side effects may I notice from receiving this medicine? Side effects that you should report to your doctor or health care professional as soon as possible: -allergic reactions like skin rash, itching or hives, swelling of the face, lips, or tongue -breathing  problems -confusion -seizures -signs and symptoms of low blood pressure like dizziness; feeling faint or lightheaded, falls; unusually weak or tired -trouble passing urine or change in the amount of urine Side effects that usually do not require medical attention (report to your doctor or health care professional if they continue or are bothersome): -constipation -dry mouth -nausea, vomiting -tiredness This list may not describe all possible side effects. Call your doctor for medical advice about side effects. You may report side effects to FDA at 1-800-FDA-1088. Where should I keep my medicine? Keep out of the reach of children. This medicine may cause accidental overdose and death if it taken by other adults, children, or pets. Mix any unused medicine with a substance like cat litter or coffee grounds. Then throw the medicine away in a sealed container like a sealed bag or a coffee can with a lid. Do not use the medicine after the expiration date. Store at room temperature between 15 and 30 degrees C (59 and 86 degrees F). NOTE: This sheet is a summary. It may not cover all possible information. If you have questions about this medicine, talk to your doctor, pharmacist, or health care provider.  2018 Elsevier/Gold Standard (2015-01-17 09:00:04)  IF you received an x-ray today, you will receive an invoice from [facility]. Please contact [facility] at [phone] with questions or concerns regarding your invoice.  IF you received labwork today, you will receive an invoice from [place] [facility]. Please contact [facility] at [phone] with questions or concerns regarding your invoice.   Our billing staff will not be able to assist you with questions regarding bills from these companies.  You will be contacted with the lab results as soon as they are available. The fastest way to get your results is to activate your My Chart account. Instructions are located on the last page of  this paperwork. If you have not heard from [place] regarding the results in 2 weeks, please contact this office.

## 2018-01-15 NOTE — Progress Notes (Signed)
Terri Ross  MRN: [id number] DOB: 1991/04/04  PCP: Sebastian Ache, PA-C  Subjective:  Pt is a 27 year old female who presents to clinic for toothache.  Pain is of her upper and lower right side of her mouth.  Pain with eating.  She is not able to sleep last night due to pain. She was here for this problem about 5 months ago and treated with Augmentin.  This made her feel better.  There are several teeth in her mouth that need to come out however she does not have dental insurance and cannot afford it. She plans to sign up for dental insurance next month and have teeth removed. Denies swelling, sour taste in mouth, palpitations, chest pain, fever, chills.  Review of Systems  Constitutional: Negative for chills and fever.  HENT: Positive for dental problem. Negative for drooling, mouth sores, sinus pressure, sinus pain, sore throat and trouble swallowing.   Respiratory: Negative for cough and shortness of breath.   Cardiovascular: Negative for chest pain and palpitations.    Patient Active Problem List   Diagnosis Date Noted  . Rheumatoid arthritis (HCC) 07/28/2016  . Chronic tension-type headache, not intractable 07/26/2016  . Depression 02/04/2016  . GAD (generalized anxiety disorder) 02/04/2016  . Mild intermittent asthma 07/16/2013    Current Outpatient Medications on File Prior to Visit  Medication Sig Dispense Refill  . albuterol (PROVENTIL HFA;VENTOLIN HFA) 108 (90 Base) MCG/ACT inhaler INHALE 2 PUFFS EVERY 6 HOURS AS NEEDED FOR WHEEZING OR SHORTNESS OF BREATH 8.5 Inhaler 6  . budesonide-formoterol (SYMBICORT) 80-4.5 MCG/ACT inhaler Inhale 2 puffs into the lungs 2 (two) times daily. 1 Inhaler 5  . cyclobenzaprine (FLEXERIL) 10 MG tablet Take 0.5-1 tablets (5-10 mg total) by mouth 3 (three) times daily as needed. 30 tablet 0  . escitalopram (LEXAPRO) 10 MG tablet Take 1 tablet (10 mg total) by mouth daily. After 3 weeks increase to 20mg  if needed. 45 tablet 2  .  hydrOXYzine (ATARAX/VISTARIL) 50 MG tablet Take 1-2 tablets (50-100 mg total) by mouth every 8 (eight) hours as needed for anxiety. 60 tablet 1  . ibuprofen (ADVIL,MOTRIN) 200 MG tablet Take 200 mg by mouth every 6 (six) hours as needed.    . montelukast (SINGULAIR) 10 MG tablet Take 1 tablet (10 mg total) by mouth at bedtime. 30 tablet 3  . busPIRone (BUSPAR) 15 MG tablet Take 1 tablet (15 mg total) by mouth 3 (three) times daily. (Patient not taking: Reported on 01/15/2018) 90 tablet 2  . diclofenac sodium (VOLTAREN) 1 % GEL Apply 4 g topically 4 (four) times daily. (Patient not taking: Reported on 11/14/2017) 100 g 0   No current facility-administered medications on file prior to visit.     Allergies  Allergen Reactions  . Voltaren [Diclofenac Sodium] Shortness Of Breath  . Doxycycline   . Lamotrigine Rash     Objective:  BP 124/90 (BP Location: Left Arm, Patient Position: Sitting, Cuff Size: Normal)   Pulse 99   Temp 99 F (37.2 C) (Oral)   Resp 16   Ht 5' 2.5" (1.588 m)   Wt 193 lb 3.2 oz (87.6 kg)   LMP 12/16/2017   SpO2 96%   BMI 34.77 kg/m   Physical Exam  Constitutional: She is oriented to person, place, and time. No distress.  HENT:  Mouth/Throat: Uvula is midline, oropharynx is clear and moist and mucous membranes are normal.    Neurological: She is alert and oriented to person, place, and  time.  Skin: Skin is warm and dry.  Psychiatric: Judgment normal.  Vitals reviewed.   Assessment and Plan :  1. Dental abscess -Patient presents complaining of tooth pain for the past several days.  This is not a new problem for her.  She plans to get dental insurance next month and have the teeth removed.  Return to clinic if no improvement - amoxicillin-clavulanate (AUGMENTIN) 875-125 MG tablet; Take 1 tablet by mouth 2 (two) times daily.  Dispense: 20 tablet; Refill: 0  2. Pain, dental - acetaminophen-codeine (TYLENOL #3) 300-30 MG tablet; Take 1-2 tablets by mouth at  bedtime as needed for moderate pain.  Dispense: 5 tablet; Refill: 0 - traMADol (ULTRAM) 50 MG tablet; Take 1 tablet (50 mg total) by mouth every 8 (eight) hours as needed for up to 3 days.  Dispense: 12 tablet; Refill: 0   Whitney Aundreya Souffrant, PA-C  [facility] at [facility] 01/15/2018 3:00 PM  Please note: Portions of this report may have been transcribed using dragon voice recognition software. Every effort was made to ensure accuracy; however, inadvertent computerized transcription errors may be present.

## 2018-01-30 ENCOUNTER — Other Ambulatory Visit: Payer: Self-pay | Admitting: Physician Assistant

## 2018-01-30 DIAGNOSIS — F411 Generalized anxiety disorder: Secondary | ICD-10-CM

## 2018-02-06 ENCOUNTER — Ambulatory Visit: Payer: BLUE CROSS/BLUE SHIELD | Admitting: Physician Assistant

## 2018-02-07 ENCOUNTER — Encounter: Payer: Self-pay | Admitting: Family Medicine

## 2018-02-07 ENCOUNTER — Other Ambulatory Visit: Payer: Self-pay

## 2018-02-07 ENCOUNTER — Ambulatory Visit: Payer: BLUE CROSS/BLUE SHIELD | Admitting: Family Medicine

## 2018-02-07 VITALS — BP 119/87 | HR 78 | Temp 97.9°F | Resp 17 | Ht 62.5 in | Wt 194.4 lb

## 2018-02-07 DIAGNOSIS — K047 Periapical abscess without sinus: Secondary | ICD-10-CM | POA: Diagnosis not present

## 2018-02-07 DIAGNOSIS — K0889 Other specified disorders of teeth and supporting structures: Secondary | ICD-10-CM | POA: Diagnosis not present

## 2018-02-07 MED ORDER — TRAMADOL HCL 50 MG PO TABS: 50.0000 mg | ORAL_TABLET | Freq: Three times a day (TID) | ORAL | 0 refills | Status: DC | PRN

## 2018-02-07 MED ORDER — ACETAMINOPHEN-CODEINE #3 300-30 MG PO TABS: 1.0000 | ORAL_TABLET | Freq: Every evening | ORAL | 0 refills | Status: DC | PRN

## 2018-02-07 MED ORDER — AMOXICILLIN-POT CLAVULANATE 875-125 MG PO TABS: 1.0000 | ORAL_TABLET | Freq: Two times a day (BID) | ORAL | 0 refills | Status: DC

## 2018-02-07 MED ORDER — FLUCONAZOLE 150 MG PO TABS: 150.0000 mg | ORAL_TABLET | Freq: Once | ORAL | 0 refills | Status: AC

## 2018-02-07 NOTE — Progress Notes (Signed)
Chief Complaint  Patient presents with  . abscess tooth f/u was seen on 01/15/18 finished round of anti    Painful to talk, pain level 9.5/10 today.  Feels like she has been ran over by a car    HPI  Pt reports that she was seen on 01/15/18 for dental abscess She reports that she has continued pain She reports that she would have to get general anesthesia for extraction and cannot afford the anesthesia She states that the tooth pain resolved after a course of antibiotics She states that the pain is throbbing and pulsing No fevers or chills   Past Medical History:  Diagnosis Date  . Anxiety   . Arthritis   . Asthma   . Rheumatoid arthritis (HCC)     Current Outpatient Medications  Medication Sig Dispense Refill  . albuterol (PROVENTIL HFA;VENTOLIN HFA) 108 (90 Base) MCG/ACT inhaler INHALE 2 PUFFS EVERY 6 HOURS AS NEEDED FOR WHEEZING OR SHORTNESS OF BREATH 8.5 Inhaler 6  . budesonide-formoterol (SYMBICORT) 80-4.5 MCG/ACT inhaler Inhale 2 puffs into the lungs 2 (two) times daily. 1 Inhaler 5  . escitalopram (LEXAPRO) 10 MG tablet Take 1 tablet (10 mg total) by mouth daily. After 3 weeks increase to 20mg  if needed. 45 tablet 2  . hydrOXYzine (ATARAX/VISTARIL) 50 MG tablet TAKE 1-2 TABLETS (50-100 MG TOTAL) BY MOUTH EVERY 8 (EIGHT) HOURS AS NEEDED FOR ANXIETY. 30 tablet 0  . montelukast (SINGULAIR) 10 MG tablet Take 1 tablet (10 mg total) by mouth at bedtime. 30 tablet 3  . acetaminophen-codeine (TYLENOL #3) 300-30 MG tablet Take 1-2 tablets by mouth at bedtime as needed for moderate pain. (Patient not taking: Reported on 02/07/2018) 5 tablet 0  . amoxicillin-clavulanate (AUGMENTIN) 875-125 MG tablet Take 1 tablet by mouth 2 (two) times daily. 20 tablet 0  . cyclobenzaprine (FLEXERIL) 10 MG tablet Take 0.5-1 tablets (5-10 mg total) by mouth 3 (three) times daily as needed. (Patient not taking: Reported on 02/07/2018) 30 tablet 0  . fluconazole (DIFLUCAN) 150 MG tablet Take 1 tablet (150 mg  total) by mouth once for 1 dose. Repeat in 3 days 2 tablet 0  . ibuprofen (ADVIL,MOTRIN) 200 MG tablet Take 200 mg by mouth every 6 (six) hours as needed.     No current facility-administered medications for this visit.     Allergies:  Allergies  Allergen Reactions  . Voltaren [Diclofenac Sodium] Shortness Of Breath  . Doxycycline   . Lamotrigine Rash    Past Surgical History:  Procedure Laterality Date  . NO PAST SURGERIES      Social History   Socioeconomic History  . Marital status: Single    Spouse name: Not on file  . Number of children: 0  . Years of education: high school  . Highest education level: Not on file  Occupational History  . Occupation: security    Comment: [employer]  Social Needs  . Financial resource strain: Not on file  . Food insecurity:    Worry: Not on file    Inability: Not on file  . Transportation needs:    Medical: Not on file    Non-medical: Not on file  Tobacco Use  . Smoking status: Never Smoker  . Smokeless tobacco: Never Used  Substance and Sexual Activity  . Alcohol use: Yes    Comment: occ  . Drug use: No  . Sexual activity: Yes  Lifestyle  . Physical activity:    Days per week: Not on file  Minutes per session: Not on file  . Stress: Not on file  Relationships  . Social connections:    Talks on phone: Not on file    Gets together: Not on file    Attends religious service: Not on file    Active member of club or organization: Not on file    Attends meetings of clubs or organizations: Not on file    Relationship status: Not on file  Other Topics Concern  . Not on file  Social History Narrative   Originally from [place].   Was raised by her paternal grandparents after her biological mother abandoned her and her twin sister as infants with their father, who was not able to raise them alone. They adopted the girls.   She ran away at age 89.    Talks to her father daily by phone.   Lives with 2 roommates.    Family  History  Problem Relation Age of Onset  . Hypertension Father   . Anxiety disorder Father   . Asthma Sister   . Arthritis Sister   . Schizophrenia Sister   . Cancer Maternal Grandmother   . Cancer Paternal Grandmother        lung, POSITIVE TOBACCO     ROS Review of Systems See HPI Constitution: No fevers or chills No malaise No diaphoresis Skin: No rash or itching Eyes: no blurry vision, no double vision GU: no dysuria or hematuria Neuro: no dizziness or headaches all others reviewed and negative   Objective: Vitals:   02/07/18 0848  BP: 119/87  Pulse: 78  Resp: 17  Temp: 97.9 F (36.6 C)  TempSrc: Oral  SpO2: 99%  Weight: 194 lb 6.4 oz (88.2 kg)  Height: 5' 2.5" (1.588 m)    Physical Exam  Constitutional: She appears well-developed and well-nourished.  HENT:  Head: Normocephalic and atraumatic.  Mouth/Throat: Oropharynx is clear and moist. Dental abscesses and dental caries present.    Pulmonary/Chest: Effort normal.      Assessment and Plan Ladasha was seen today for abscess tooth f/u was seen on 01/15/18 finished round of anti.  Diagnoses and all orders for this visit:  Dental abscess -     amoxicillin-clavulanate (AUGMENTIN) 875-125 MG tablet; Take 1 tablet by mouth 2 (two) times daily. -     fluconazole (DIFLUCAN) 150 MG tablet; Take 1 tablet (150 mg total) by mouth once for 1 dose. Repeat in 3 days  -  Advised follow up with Dentistry -  Augmentin bid -  Diflucan in case she develops yeast infection -  tylenol for pain    Nahia Nissan A Creta Levin

## 2018-02-07 NOTE — Patient Instructions (Addendum)
If you have lab work done today you will be contacted with your lab results within the next 2 weeks.  If you have not heard from [place] then please contact us. The fastest way to get your results is to register for My Chart.   IF you received an x-ray today, you will receive an invoice from [facility]. Please contact [facility] at [phone] with questions or concerns regarding your invoice.   IF you received labwork today, you will receive an invoice from [place]. Please contact [facility] at [phone] with questions or concerns regarding your invoice.   Our billing staff will not be able to assist you with questions regarding bills from these companies.  You will be contacted with the lab results as soon as they are available. The fastest way to get your results is to activate your My Chart account. Instructions are located on the last page of this paperwork. If you have not heard from [place] regarding the results in 2 weeks, please contact this office.     Dental Abscess A dental abscess is a collection of pus in or around a tooth. What are the causes? This condition is caused by a bacterial infection around the root of the tooth that involves the inner part of the tooth (pulp). It may result from:  Severe tooth decay.  Trauma to the tooth that allows bacteria to enter into the pulp, such as a broken or chipped tooth.  Severe gum disease around a tooth.  What are the signs or symptoms? Symptoms of this condition include:  Severe pain in and around the infected tooth.  Swelling and redness around the infected tooth, in the mouth, or in the face.  Tenderness.  Pus drainage.  Bad breath.  Bitter taste in the mouth.  Difficulty swallowing.  Difficulty opening the mouth.  Nausea.  Vomiting.  Chills.  Swollen neck glands.  Fever.  How is this diagnosed? This condition is diagnosed with examination of the infected tooth. During the exam, your  dentist may tap on the infected tooth. Your dentist will also ask about your medical and dental history and may order X-rays. How is this treated? This condition is treated by eliminating the infection. This may be done with:  Antibiotic medicine.  A root canal. This may be performed to save the tooth.  Pulling (extracting) the tooth. This may also involve draining the abscess. This is done if the tooth cannot be saved.  Follow these instructions at home:  Take medicines only as directed by your dentist.  If you were prescribed antibiotic medicine, finish all of it even if you start to feel better.  Rinse your mouth (gargle) often with salt water to relieve pain or swelling.  Do not drive or operate heavy machinery while taking pain medicine.  Do not apply heat to the outside of your mouth.  Keep all follow-up visits as directed by your dentist. This is important. Contact a health care provider if:  Your pain is worse and is not helped by medicine. Get help right away if:  You have a fever or chills.  Your symptoms suddenly get worse.  You have a very bad headache.  You have problems breathing or swallowing.  You have trouble opening your mouth.  You have swelling in your neck or around your eye. This information is not intended to replace advice given to you by your health care provider. Make sure you discuss any questions you have with your  health care provider. Document Released: 04/24/2005 Document Revised: 09/02/2015 Document Reviewed: 04/21/2014 Elsevier Interactive Patient Education  [employer].

## 2018-02-08 ENCOUNTER — Encounter (HOSPITAL_COMMUNITY): Payer: Self-pay | Admitting: Emergency Medicine

## 2018-02-08 ENCOUNTER — Other Ambulatory Visit: Payer: Self-pay

## 2018-02-08 ENCOUNTER — Emergency Department (HOSPITAL_COMMUNITY)
Admission: EM | Admit: 2018-02-08 | Discharge: 2018-02-08 | Disposition: A | Discharge status: Home / Self Care | Payer: BLUE CROSS/BLUE SHIELD | Attending: Emergency Medicine | Admitting: Emergency Medicine

## 2018-02-08 DIAGNOSIS — K0889 Other specified disorders of teeth and supporting structures: Secondary | ICD-10-CM | POA: Diagnosis not present

## 2018-02-08 DIAGNOSIS — Z79899 Other long term (current) drug therapy: Secondary | ICD-10-CM | POA: Diagnosis not present

## 2018-02-08 DIAGNOSIS — J4520 Mild intermittent asthma, uncomplicated: Secondary | ICD-10-CM | POA: Insufficient documentation

## 2018-02-08 MED ORDER — OXYCODONE-ACETAMINOPHEN 5-325 MG PO TABS
1.0000 | ORAL_TABLET | ORAL | Status: DC | PRN
  Administered 2018-02-08: 1 via ORAL
  Filled 2018-02-08: qty 1

## 2018-02-08 MED ORDER — HYDROCODONE-ACETAMINOPHEN 5-325 MG PO TABS
1.0000 | ORAL_TABLET | Freq: Once | ORAL | Status: AC
  Administered 2018-02-08: 1 via ORAL
  Filled 2018-02-08: qty 1

## 2018-02-08 NOTE — ED Triage Notes (Signed)
Pt reports upper right dental abscess that shoots pain through her jaw for the last couple of days that became worse today. No oral trauma. Pt went to doctors today for antibiotics, one dose taken so far. Pt took Tylenol, Codeine and Ibuprofen with no relief. Pt very anxious in triage and reports taking hydroxyzine. Pt tearful in triage.

## 2018-02-08 NOTE — ED Provider Notes (Signed)
[facility] EMERGENCY DEPARTMENT Provider Note   CSN: [id number] Arrival date & time: 02/08/18  0041     History   Chief Complaint Chief Complaint  Patient presents with  . Dental Pain    HPI Terri Ross is a 27 y.o. female.  The history is provided by the patient.  Dental Pain   This is a recurrent problem. The problem occurs constantly. The pain is moderate. Treatments tried: antibtioics  and pain medicine.   Presents with recurrent dental pain.  She has been on multiple rounds of antibiotics.  She is currently on antibiotics for dentist.  She reports she will required sedation for dental extraction Past Medical History:  Diagnosis Date  . Anxiety   . Arthritis   . Asthma   . Rheumatoid arthritis [facility])     Patient Active Problem List   Diagnosis Date Noted  . Rheumatoid arthritis (HCC) 07/28/2016  . Chronic tension-type headache, not intractable 07/26/2016  . Depression 02/04/2016  . GAD (generalized anxiety disorder) 02/04/2016  . Mild intermittent asthma 07/16/2013    Past Surgical History:  Procedure Laterality Date  . NO PAST SURGERIES       OB History   None      Home Medications    Prior to Admission medications   Medication Sig Start Date End Date Taking? Authorizing Provider  acetaminophen-codeine (TYLENOL #3) 300-30 MG tablet Take 1-2 tablets by mouth at bedtime as needed for severe pain. 02/07/18   Doristine Bosworth, MD  albuterol (PROVENTIL HFA;VENTOLIN HFA) 108 (90 Base) MCG/ACT inhaler INHALE 2 PUFFS EVERY 6 HOURS AS NEEDED FOR WHEEZING OR SHORTNESS OF BREATH 11/14/17   McVey, Madelaine Bhat, PA-C  amoxicillin-clavulanate (AUGMENTIN) 875-125 MG tablet Take 1 tablet by mouth 2 (two) times daily. 02/07/18   Doristine Bosworth, MD  budesonide-formoterol (SYMBICORT) 80-4.5 MCG/ACT inhaler Inhale 2 puffs into the lungs 2 (two) times daily. 11/14/17   McVey, Madelaine Bhat, PA-C  cyclobenzaprine (FLEXERIL) 10 MG tablet Take 0.5-1  tablets (5-10 mg total) by mouth 3 (three) times daily as needed. Patient not taking: Reported on 02/07/2018 06/13/17   Trena Platt D, PA  escitalopram (LEXAPRO) 10 MG tablet Take 1 tablet (10 mg total) by mouth daily. After 3 weeks increase to 20mg  if needed. 12/25/17   McVey, 12/27/17, PA-C  hydrOXYzine (ATARAX/VISTARIL) 50 MG tablet TAKE 1-2 TABLETS (50-100 MG TOTAL) BY MOUTH EVERY 8 (EIGHT) HOURS AS NEEDED FOR ANXIETY. 01/30/18   McVey, 02/01/18, PA-C  ibuprofen (ADVIL,MOTRIN) 200 MG tablet Take 200 mg by mouth every 6 (six) hours as needed.    [provider]  montelukast (SINGULAIR) 10 MG tablet Take 1 tablet (10 mg total) by mouth at bedtime. 10/16/17   McVey, 12/16/17, PA-C  traMADol (ULTRAM) 50 MG tablet Take 1 tablet (50 mg total) by mouth every 8 (eight) hours as needed for severe pain. 02/07/18   04/09/18, MD    Family History Family History  Problem Relation Age of Onset  . Hypertension Father   . Anxiety disorder Father   . Asthma Sister   . Arthritis Sister   . Schizophrenia Sister   . Cancer Maternal Grandmother   . Cancer Paternal Grandmother        lung, POSITIVE TOBACCO    Social History Social History   Tobacco Use  . Smoking status: Never Smoker  . Smokeless tobacco: Never Used  Substance Use Topics  . Alcohol use: Yes  Comment: occ  . Drug use: No     Allergies   Voltaren [diclofenac sodium]; Doxycycline; and Lamotrigine   Review of Systems Review of Systems  Constitutional: Negative for fever.  HENT: Positive for dental problem.   Gastrointestinal: Positive for vomiting.     Physical Exam Updated Vital Signs BP (!) 122/94 (BP Location: Right Arm)   Pulse (!) 111   Temp 98.6 F (37 C) (Oral)   Resp (!) 22   Ht 1.575 m (5\' 2" )   Wt 88 kg   LMP 01/20/2018   SpO2 100%   BMI 35.48 kg/m   Physical Exam CONSTITUTIONAL: Well developed/well nourished HEAD AND FACE:  Normocephalic/atraumatic EYES: EOMI/PERRL ENMT: Mucous membranes moist.  Poor dentition.  No trismus.  No focal abscess noted.  Tenderness to right upper molar NECK: supple no meningeal signs CV: S1/S2 noted, no murmurs/rubs/gallops noted LUNGS: Lungs are clear to auscultation bilaterally, no apparent distress ABDOMEN: soft, nontender, no rebound or guarding NEURO: Pt is awake/alert, moves all extremitiesx4 EXTREMITIES:full ROM SKIN: warm, color normal She is anxious  ED Treatments / Results  Labs (all labs ordered are listed, but only abnormal results are displayed) Labs Reviewed - No data to display  EKG None  Radiology No results found.  Procedures Procedures (including critical care time)  Medications Ordered in ED Medications  HYDROcodone-acetaminophen (NORCO/VICODIN) 5-325 MG per tablet 1 tablet (1 tablet Oral Given 02/08/18 0422)     Initial Impression / Assessment and Plan / ED Course  I have reviewed the triage vital signs and the nursing notes.   Already on antibiotics.  Referred back to her dentist  Final Clinical Impressions(s) / ED Diagnoses   Final diagnoses:  Pain, dental    ED Discharge Orders    None       04/10/18, MD 02/08/18 [phone]

## 2018-02-14 ENCOUNTER — Other Ambulatory Visit: Payer: Self-pay

## 2018-02-14 ENCOUNTER — Ambulatory Visit (INDEPENDENT_AMBULATORY_CARE_PROVIDER_SITE_OTHER): Payer: BLUE CROSS/BLUE SHIELD | Admitting: Physician Assistant

## 2018-02-14 ENCOUNTER — Encounter: Payer: Self-pay | Admitting: Physician Assistant

## 2018-02-14 VITALS — BP 108/88 | HR 94 | Temp 98.0°F | Resp 16 | Ht 62.0 in | Wt 195.2 lb

## 2018-02-14 DIAGNOSIS — F410 Panic disorder [episodic paroxysmal anxiety] without agoraphobia: Secondary | ICD-10-CM

## 2018-02-14 DIAGNOSIS — F339 Major depressive disorder, recurrent, unspecified: Secondary | ICD-10-CM

## 2018-02-14 DIAGNOSIS — F411 Generalized anxiety disorder: Secondary | ICD-10-CM

## 2018-02-14 MED ORDER — ALPRAZOLAM 0.25 MG PO TABS: 0.2500 mg | ORAL_TABLET | Freq: Two times a day (BID) | ORAL | 1 refills | Status: DC | PRN

## 2018-02-14 MED ORDER — VENLAFAXINE HCL ER 37.5 MG PO CP24: 37.5000 mg | ORAL_CAPSULE | Freq: Every day | ORAL | 1 refills | Status: DC

## 2018-02-14 NOTE — Patient Instructions (Addendum)
Effexor XR 37.5 once daily; You may increase to 75 mg once daily after 10-14 days; thereafter, may increase dose in increments of 37.5 mg/day at intervals of ?7 days based on response and tolerability.  (usual dosage: 75 to 225 mg once daily)  Come back and see me in 4 weeks.   Venlafaxine extended-release capsules What is this medicine? VENLAFAXINE(VEN la fax een) is used to treat depression, anxiety and panic disorder. This medicine may be used for other purposes; ask your health care provider or pharmacist if you have questions. COMMON BRAND NAME(S): Effexor XR What should I tell my health care provider before I take this medicine? They need to know if you have any of these conditions: -bleeding disorders -glaucoma -heart disease -high blood pressure -high cholesterol -kidney disease -liver disease -low levels of sodium in the blood -mania or bipolar disorder -seizures -suicidal thoughts, plans, or attempt; a previous suicide attempt by you or a family -take medicines that treat or prevent blood clots -thyroid disease -an unusual or allergic reaction to venlafaxine, desvenlafaxine, other medicines, foods, dyes, or preservatives -pregnant or trying to get pregnant -breast-feeding How should I use this medicine? Take this medicine by mouth with a full glass of water. Follow the directions on the prescription label. Do not cut, crush, or chew this medicine. Take it with food. If needed, the capsule may be carefully opened and the entire contents sprinkled on a spoonful of cool applesauce. Swallow the applesauce/pellet mixture right away without chewing and follow with a glass of water to ensure complete swallowing of the pellets. Try to take your medicine at about the same time each day. Do not take your medicine more often than directed. Do not stop taking this medicine suddenly except upon the advice of your doctor. Stopping this medicine too quickly may cause serious side effects  or your condition may worsen. A special MedGuide will be given to you by the pharmacist with each prescription and refill. Be sure to read this information carefully each time. Talk to your pediatrician regarding the use of this medicine in children. Special care may be needed. Overdosage: If you think you have taken too much of this medicine contact a poison control center or emergency room at once. NOTE: This medicine is only for you. Do not share this medicine with others. What if I miss a dose? If you miss a dose, take it as soon as you can. If it is almost time for your next dose, take only that dose. Do not take double or extra doses. What may interact with this medicine? Do not take this medicine with any of the following medications: -certain medicines for fungal infections like fluconazole, itraconazole, ketoconazole, posaconazole, voriconazole -cisapride -desvenlafaxine -dofetilide -dronedarone -duloxetine -levomilnacipran -linezolid -MAOIs like Carbex, Eldepryl, Marplan, Nardil, and Parnate -methylene blue (injected into a vein) -milnacipran -pimozide -thioridazine -ziprasidone This medicine may also interact with the following medications: -amphetamines -aspirin and aspirin-like medicines -certain medicines for depression, anxiety, or psychotic disturbances -certain medicines for migraine headaches like almotriptan, eletriptan, frovatriptan, naratriptan, rizatriptan, sumatriptan, zolmitriptan -certain medicines for sleep -certain medicines that treat or prevent blood clots like dalteparin, enoxaparin, warfarin -cimetidine -clozapine -diuretics -fentanyl -furazolidone -indinavir -isoniazid -lithium -metoprolol -NSAIDS, medicines for pain and inflammation, like ibuprofen or naproxen -other medicines that prolong the QT interval (cause an abnormal heart rhythm) -procarbazine -rasagiline -supplements like St. John's wort, kava kava,  valerian -tramadol -tryptophan This list may not describe all possible interactions. Give your health care provider  a list of all the medicines, herbs, non-prescription drugs, or dietary supplements you use. Also tell them if you smoke, drink alcohol, or use illegal drugs. Some items may interact with your medicine. What should I watch for while using this medicine? Tell your doctor if your symptoms do not get better or if they get worse. Visit your doctor or health care professional for regular checks on your progress. Because it may take several weeks to see the full effects of this medicine, it is important to continue your treatment as prescribed by your doctor. Patients and their families should watch out for new or worsening thoughts of suicide or depression. Also watch out for sudden changes in feelings such as feeling anxious, agitated, panicky, irritable, hostile, aggressive, impulsive, severely restless, overly excited and hyperactive, or not being able to sleep. If this happens, especially at the beginning of treatment or after a change in dose, call your health care professional. This medicine can cause an increase in blood pressure. Check with your doctor for instructions on monitoring your blood pressure while taking this medicine. You may get drowsy or dizzy. Do not drive, use machinery, or do anything that needs mental alertness until you know how this medicine affects you. Do not stand or sit up quickly, especially if you are an older patient. This reduces the risk of dizzy or fainting spells. Alcohol may interfere with the effect of this medicine. Avoid alcoholic drinks. Your mouth may get dry. Chewing sugarless gum, sucking hard candy and drinking plenty of water will help. Contact your doctor if the problem does not go away or is severe. What side effects may I notice from receiving this medicine? Side effects that you should report to your doctor or health care professional as soon as  possible: -allergic reactions like skin rash, itching or hives, swelling of the face, lips, or tongue -anxious -breathing problems -confusion -changes in vision -chest pain -confusion -elevated mood, decreased need for sleep, racing thoughts, impulsive behavior -eye pain -fast, irregular heartbeat -feeling faint or lightheaded, falls -feeling agitated, angry, or irritable -hallucination, loss of contact with reality -high blood pressure -loss of balance or coordination -palpitations -redness, blistering, peeling or loosening of the skin, including inside the mouth -restlessness, pacing, inability to keep still -seizures -stiff muscles -suicidal thoughts or other mood changes -trouble passing urine or change in the amount of urine -trouble sleeping -unusual bleeding or bruising -unusually weak or tired -vomiting Side effects that usually do not require medical attention (report to your doctor or health care professional if they continue or are bothersome): -change in sex drive or performance -change in appetite or weight -constipation -dizziness -dry mouth -headache -increased sweating -nausea -tired This list may not describe all possible side effects. Call your doctor for medical advice about side effects. You may report side effects to FDA at 1-800-FDA-1088. Where should I keep my medicine? Keep out of the reach of children. Store at a controlled temperature between 20 and 25 degrees C (68 degrees and 77 degrees F), in a dry place. Throw away any unused medicine after the expiration date. NOTE: This sheet is a summary. It may not cover all possible information. If you have questions about this medicine, talk to your doctor, pharmacist, or health care provider.  2018 Elsevier/Gold Standard (2015-09-23 18:38:02)

## 2018-02-14 NOTE — Progress Notes (Addendum)
Terri Ross  MRN: [id number] DOB: [birth year]  PCP: Sebastian Ache, PA-C  Subjective:  Pt is a 27 year old female who presents to clinic for f/u anxiety and depression  Stopped Celexa at last OV 2 months ago due to muscle twitching. Started Lexapro."I feel like a zombie".  Hydroxyzine 50-100mg  PRN for breakthrough anxiety She has tried wellbutrin in the past - couldn't sleep for days.  Denies HI or SI  Review of Systems  Psychiatric/Behavioral: Positive for dysphoric mood. Negative for self-injury and suicidal ideas. The patient is nervous/anxious.     Patient Active Problem List   Diagnosis Date Noted  . Rheumatoid arthritis (HCC) 07/28/2016  . Chronic tension-type headache, not intractable 07/26/2016  . Depression 02/04/2016  . GAD (generalized anxiety disorder) 02/04/2016  . Mild intermittent asthma 07/16/2013    Current Outpatient Medications on File Prior to Visit  Medication Sig Dispense Refill  . albuterol (PROVENTIL HFA;VENTOLIN HFA) 108 (90 Base) MCG/ACT inhaler INHALE 2 PUFFS EVERY 6 HOURS AS NEEDED FOR WHEEZING OR SHORTNESS OF BREATH 8.5 Inhaler 6  . amoxicillin-clavulanate (AUGMENTIN) 875-125 MG tablet Take 1 tablet by mouth 2 (two) times daily. 20 tablet 0  . budesonide-formoterol (SYMBICORT) 80-4.5 MCG/ACT inhaler Inhale 2 puffs into the lungs 2 (two) times daily. 1 Inhaler 5  . escitalopram (LEXAPRO) 10 MG tablet Take 1 tablet (10 mg total) by mouth daily. After 3 weeks increase to 20mg  if needed. 45 tablet 2  . hydrOXYzine (ATARAX/VISTARIL) 50 MG tablet TAKE 1-2 TABLETS (50-100 MG TOTAL) BY MOUTH EVERY 8 (EIGHT) HOURS AS NEEDED FOR ANXIETY. 30 tablet 0  . ibuprofen (ADVIL,MOTRIN) 200 MG tablet Take 200 mg by mouth every 6 (six) hours as needed.    . montelukast (SINGULAIR) 10 MG tablet Take 1 tablet (10 mg total) by mouth at bedtime. 30 tablet 3  . acetaminophen-codeine (TYLENOL #3) 300-30 MG tablet Take 1-2 tablets by mouth at bedtime as needed for  severe pain. (Patient not taking: Reported on 02/14/2018) 5 tablet 0  . cyclobenzaprine (FLEXERIL) 10 MG tablet Take 0.5-1 tablets (5-10 mg total) by mouth 3 (three) times daily as needed. (Patient not taking: Reported on 02/07/2018) 30 tablet 0  . traMADol (ULTRAM) 50 MG tablet Take 1 tablet (50 mg total) by mouth every 8 (eight) hours as needed for severe pain. (Patient not taking: Reported on 02/14/2018) 30 tablet 0   No current facility-administered medications on file prior to visit.     Allergies  Allergen Reactions  . Voltaren [Diclofenac Sodium] Shortness Of Breath  . Doxycycline   . Lamotrigine Rash     Objective:  BP 108/88 (BP Location: Left Arm, Patient Position: Sitting, Cuff Size: Normal)   Pulse 94   Temp 98 F (36.7 C) (Oral)   Resp 16   Ht 5\' 2"  (1.575 m)   Wt 195 lb 3.2 oz (88.5 kg)   LMP 01/20/2018   SpO2 98%   BMI 35.70 kg/m   Physical Exam  Constitutional: She appears well-developed.  Psychiatric: She has a normal mood and affect. Her behavior is normal. Judgment and thought content normal.  Vitals reviewed.     Office Visit from 02/14/2018 in Primary Care at [facility]  PHQ-9 Total Score  22      Assessment and Plan :  1. Episode of recurrent major depressive disorder, unspecified depression episode severity (HCC) 2. GAD (generalized anxiety disorder) - pt presents for f/u anxiety and depression. She "feels like a zombie" on Lexapro, expereinced muscle  twitching on Celexa, did not sleep with Wellbutrin. Will d/c lexapro and start Effexor. RTC in 4 weeks to recheck.  - venlafaxine XR (EFFEXOR XR) 37.5 MG 24 hr capsule; Take 1 capsule (37.5 mg total) by mouth daily with breakfast. May increase to 75mg /day after 2 weeks.  Dispense: 60 capsule; Refill: 1  3. Panic attacks - ALPRAZolam (XANAX) 0.25 MG tablet; Take 1-2 tablets (0.25-0.5 mg total) by mouth 2 (two) times daily as needed (panic attack).  Dispense: 20 tablet; Refill: 1  Whitney Alireza Pollack, PA-C    Primary Care at [facility] 02/14/2018 10:02 AM  Please note: Portions of this report may have been transcribed using dragon voice recognition software. Every effort was made to ensure accuracy; however, inadvertent computerized transcription errors may be present.

## 2018-02-14 NOTE — Addendum Note (Signed)
Addended by: Sebastian Ache on: 02/14/2018 10:38 AM   Modules accepted: Orders

## 2018-03-05 ENCOUNTER — Telehealth: Payer: Self-pay | Admitting: *Deleted

## 2018-03-05 ENCOUNTER — Other Ambulatory Visit: Payer: Self-pay | Admitting: Physician Assistant

## 2018-03-05 DIAGNOSIS — F411 Generalized anxiety disorder: Secondary | ICD-10-CM

## 2018-03-05 NOTE — Telephone Encounter (Signed)
Mcvey,      See note below.  Patient was advised to stop medication.  She has rescheduled with you.  Will let us know if she needs a referral for her counselor since she hasn't seen her in awhile.           Patient came in today stating her Effexor was given her bad thoughts she stated she did not want to commit suicide. She  Also stated it made her very  tired.   After talking with the patient for 15 minutes she was advised to stop medication and make an appointment to be seen.  She was also advised that if her thoughts changed and she felt like she wanted to hurt herself she needed to go straight to hospital. Patient is voiced understanding.    Patient was advised to establish care with a new provider she was given santiago's card.   She booked an appointment with [facility] on Wednesday 11-6 but she was advised we could get her in sooner but patient declined, stated she would call back in 48 hours if she felt she needed to be seen earlier.      Patient was also advised to contact her counselor she hasn't been in a year but would like to start again.  Patient stated she would do this today.   Patient was reluctant to go to ER because of cost, was advised her well being is very important and she would need to be seen if she was having thoughts of hurting herself.  Patient voiced understanding.

## 2018-03-07 DIAGNOSIS — F411 Generalized anxiety disorder: Secondary | ICD-10-CM | POA: Diagnosis not present

## 2018-03-13 ENCOUNTER — Ambulatory Visit: Payer: BLUE CROSS/BLUE SHIELD | Admitting: Physician Assistant

## 2018-03-13 ENCOUNTER — Other Ambulatory Visit: Payer: Self-pay

## 2018-03-13 ENCOUNTER — Encounter: Payer: Self-pay | Admitting: Physician Assistant

## 2018-03-13 VITALS — BP 114/80 | HR 103 | Temp 97.9°F | Resp 16 | Ht 62.5 in | Wt 196.6 lb

## 2018-03-13 DIAGNOSIS — F411 Generalized anxiety disorder: Secondary | ICD-10-CM

## 2018-03-13 MED ORDER — CLONAZEPAM 0.25 MG PO TBDP: 0.2500 mg | ORAL_TABLET | Freq: Every day | ORAL | 0 refills | Status: DC

## 2018-03-13 NOTE — Patient Instructions (Addendum)
Clonazepam can be started at 0.25 mg orally once daily. After 2 weeks if needed you can take 0.25mg  twice daily. After another two weeks if needed you can take 0.5mg  twice daily.   Come back and see me in 4 weeks.   Withdrawal: Rebound or withdrawal symptoms may occur following abrupt discontinuation or large decreases in dose. Use caution when reducing dose or withdrawing therapy; decrease slowly and monitor for withdrawal symptoms.   Clonazepam tablets What is this medicine? CLONAZEPAM (kloe NA ze pam) is a benzodiazepine. It is used to treat certain types of seizures. It is also used to treat panic disorder. This medicine may be used for other purposes; ask your health care provider or pharmacist if you have questions. COMMON BRAND NAME(S): Ceberclon, Klonopin What should I tell my health care provider before I take this medicine? They need to know if you have any of these conditions: -an alcohol or drug abuse problem -bipolar disorder, depression, psychosis or other mental health condition -glaucoma -kidney or liver disease -lung or breathing disease -myasthenia gravis -Parkinson's disease -porphyria -seizures or a history of seizures -suicidal thoughts -an unusual or allergic reaction to clonazepam, other benzodiazepines, foods, dyes, or preservatives -pregnant or trying to get pregnant -breast-feeding How should I use this medicine? Take this medicine by mouth with a glass of water. Follow the directions on the prescription label. If it upsets your stomach, take it with food or milk. Take your medicine at regular intervals. Do not take it more often than directed. Do not stop taking or change the dose except on the advice of your doctor or health care professional. A special MedGuide will be given to you by the pharmacist with each prescription and refill. Be sure to read this information carefully each time. Talk to your pediatrician regarding the use of this medicine in  children. Special care may be needed. Overdosage: If you think you have taken too much of this medicine contact a poison control center or emergency room at once. NOTE: This medicine is only for you. Do not share this medicine with others. What if I miss a dose? If you miss a dose, take it as soon as you can. If it is almost time for your next dose, take only that dose. Do not take double or extra doses. What may interact with this medicine? Do not take this medication with any of the following medicines: -narcotic medicines for cough -sodium oxybate This medicine may also interact with the following medications: -alcohol -antihistamines for allergy, cough and cold -antiviral medicines for HIV or AIDS -certain medicines for anxiety or sleep -certain medicines for depression, like amitriptyline, fluoxetine, sertraline -certain medicines for fungal infections like ketoconazole and itraconazole -certain medicines for seizures like carbamazepine, phenobarbital, phenytoin, primidone -general anesthetics like halothane, isoflurane, methoxyflurane, propofol -local anesthetics like lidocaine, pramoxine, tetracaine -medicines that relax muscles for surgery -narcotic medicines for pain -phenothiazines like chlorpromazine, mesoridazine, prochlorperazine, thioridazine This list may not describe all possible interactions. Give your health care provider a list of all the medicines, herbs, non-prescription drugs, or dietary supplements you use. Also tell them if you smoke, drink alcohol, or use illegal drugs. Some items may interact with your medicine. What should I watch for while using this medicine? Tell your doctor or health care professional if your symptoms do not start to get better or if they get worse. Do not stop taking except on your doctor's advice. You may develop a severe reaction. Your doctor will tell you how  much medicine to take. You may get drowsy or dizzy. Do not drive, use machinery,  or do anything that needs mental alertness until you know how this medicine affects you. To reduce the risk of dizzy and fainting spells, do not stand or sit up quickly, especially if you are an older patient. Alcohol may increase dizziness and drowsiness. Avoid alcoholic drinks. If you are taking another medicine that also causes drowsiness, you may have more side effects. Give your health care provider a list of all medicines you use. Your doctor will tell you how much medicine to take. Do not take more medicine than directed. Call emergency for help if you have problems breathing or unusual sleepiness. The use of this medicine may increase the chance of suicidal thoughts or actions. Pay special attention to how you are responding while on this medicine. Any worsening of mood, or thoughts of suicide or dying should be reported to your health care professional right away. What side effects may I notice from receiving this medicine? Side effects that you should report to your doctor or health care professional as soon as possible: -allergic reactions like skin rash, itching or hives, swelling of the face, lips, or tongue -breathing problems -confusion -loss of balance or coordination -signs and symptoms of low blood pressure like dizziness; feeling faint or lightheaded, falls; unusually weak or tired -suicidal thoughts or mood changes Side effects that usually do not require medical attention (report to your doctor or health care professional if they continue or are bothersome): -dizziness -headache -tiredness -upset stomach This list may not describe all possible side effects. Call your doctor for medical advice about side effects. You may report side effects to FDA at 1-800-FDA-1088. Where should I keep my medicine? Keep out of the reach of children. This medicine can be abused. Keep your medicine in a safe place to protect it from theft. Do not share this medicine with anyone. Selling or giving  away this medicine is dangerous and against the law. This medicine may cause accidental overdose and death if taken by other adults, children, or pets. Mix any unused medicine with a substance like cat litter or coffee grounds. Then throw the medicine away in a sealed container like a sealed bag or a coffee can with a lid. Do not use the medicine after the expiration date. Store at room temperature between 15 and 30 degrees C (59 and 86 degrees F). Protect from light. Keep container tightly closed. NOTE: This sheet is a summary. It may not cover all possible information. If you have questions about this medicine, talk to your doctor, pharmacist, or health care provider.  2018 Elsevier/Gold Standard (2015-10-01 18:46:32)   IF you received an x-ray today, you will receive an invoice from [facility]. Please contact [facility] at [phone] with questions or concerns regarding your invoice.   IF you received labwork today, you will receive an invoice from [facility]. Please contact [facility] at [phone] with questions or concerns regarding your invoice.   Our billing staff will not be able to assist you with questions regarding bills from these companies.  You will be contacted with the lab results as soon as they are available. The fastest way to get your results is to activate your My Chart account. Instructions are located on the last page of this paperwork. If you have not heard from [place] regarding the results in 2 weeks, please contact this office.

## 2018-03-13 NOTE — Progress Notes (Signed)
Silver Achey  MRN: [id number] DOB: [birth year]  PCP: Sebastian Ache, PA-C  Subjective:  Pt is a 27 year old female who presents to clinic for f/u Effexor.  She c/o episodes or mania and depression. "I blew a paycheck on stuff I don't need", she dyed her hair 2 different colors.  "The next week I was in bed all day and thought the world would be better off without me" Denies SI since she stopped Effexor.  Endorses con't muscle twitches since stopping celexa several months ago.  Now she is taking 1-2 tabs of Xanax 0.25mg  for panic attacks. She is taking this daily.   She has a new therapist - Cornelius Moras at [facility]. She likes him.   She has tried: Wellbutrin - did not sleep  Celexa - muscle twitching  Lexapro - "feels like a zombie" She has been on Lithium in high school (per pt)  No history of substance abuse.   Review of Systems  Gastrointestinal: Negative for nausea and vomiting.  Psychiatric/Behavioral: Positive for dysphoric mood. Negative for agitation, hallucinations, self-injury and suicidal ideas. The patient is hyperactive.     Patient Active Problem List   Diagnosis Date Noted  . Rheumatoid arthritis (HCC) 07/28/2016  . Chronic tension-type headache, not intractable 07/26/2016  . Depression 02/04/2016  . GAD (generalized anxiety disorder) 02/04/2016  . Mild intermittent asthma 07/16/2013    Current Outpatient Medications on File Prior to Visit  Medication Sig Dispense Refill  . albuterol (PROVENTIL HFA;VENTOLIN HFA) 108 (90 Base) MCG/ACT inhaler INHALE 2 PUFFS EVERY 6 HOURS AS NEEDED FOR WHEEZING OR SHORTNESS OF BREATH 8.5 Inhaler 6  . ALPRAZolam (XANAX) 0.25 MG tablet Take 1-2 tablets (0.25-0.5 mg total) by mouth 2 (two) times daily as needed (panic attack). 20 tablet 1  . budesonide-formoterol (SYMBICORT) 80-4.5 MCG/ACT inhaler Inhale 2 puffs into the lungs 2 (two) times daily. 1 Inhaler 5  . hydrOXYzine (ATARAX/VISTARIL) 50 MG tablet TAKE 1 TO 2  TABLETS BY MOUTH EVERY 8 HOURS AS NEEDED FOR ANXIETY 30 tablet 0  . montelukast (SINGULAIR) 10 MG tablet Take 1 tablet (10 mg total) by mouth at bedtime. 30 tablet 3  . venlafaxine XR (EFFEXOR XR) 37.5 MG 24 hr capsule Take 1 capsule (37.5 mg total) by mouth daily with breakfast. May increase to 75mg /day after 2 weeks. (Patient not taking: Reported on 03/13/2018) 60 capsule 1   No current facility-administered medications on file prior to visit.     Allergies  Allergen Reactions  . Voltaren [Diclofenac Sodium] Shortness Of Breath  . Doxycycline   . Lamotrigine Rash     Objective:  BP 114/80 (BP Location: Left Arm, Patient Position: Sitting, Cuff Size: Normal)   Pulse (!) 103   Temp 97.9 F (36.6 C) (Oral)   Resp 16   Ht 5' 2.5" (1.588 m)   Wt 196 lb 9.6 oz (89.2 kg)   LMP 02/27/2018   SpO2 97%   BMI 35.39 kg/m   Physical Exam  Constitutional: She appears well-developed and well-nourished.  Psychiatric: She has a normal mood and affect. Thought content normal.  Vitals reviewed.     Office Visit from 02/14/2018 in Primary Care at [facility] Total Score  22      Assessment and Plan :  1. Generalized anxiety disorder -Patient presents for follow-up Effexor.  Since starting the medication she is experienced episodes of mania and severe dysphoria.  Symptoms resolved since discontinuing Effexor. MOD questionnaire negative -scanned into chart.  She has tried Wellbutrin and Lexapro.  Plan to refer to psychiatry for evaluation.  She has no history of substance abuse.  Will try Klonopin -medication side effect profile and d/c of therapy discussed and printed out for patient.  Return to clinic in 3 to 4 weeks to recheck. - Ambulatory referral to Psychiatry - clonazePAM (KLONOPIN) 0.25 MG disintegrating tablet; Take 1 tablet (0.25 mg total) by mouth daily. May increase to twice daily after 2 weeks if needed.  Dispense: 60 tablet; Refill: 0   Marco Collie, PA-C  Primary Care at  [facility] 03/13/2018 12:38 PM  Please note: Portions of this report may have been transcribed using dragon voice recognition software. Every effort was made to ensure accuracy; however, inadvertent computerized transcription errors may be present.

## 2018-03-14 DIAGNOSIS — F411 Generalized anxiety disorder: Secondary | ICD-10-CM | POA: Diagnosis not present

## 2018-03-21 ENCOUNTER — Ambulatory Visit: Payer: BLUE CROSS/BLUE SHIELD | Admitting: Physician Assistant

## 2018-03-21 DIAGNOSIS — F411 Generalized anxiety disorder: Secondary | ICD-10-CM | POA: Diagnosis not present

## 2018-03-27 DIAGNOSIS — F411 Generalized anxiety disorder: Secondary | ICD-10-CM | POA: Diagnosis not present

## 2018-04-03 ENCOUNTER — Other Ambulatory Visit: Payer: Self-pay | Admitting: Physician Assistant

## 2018-04-03 DIAGNOSIS — F411 Generalized anxiety disorder: Secondary | ICD-10-CM | POA: Diagnosis not present

## 2018-04-10 ENCOUNTER — Encounter: Payer: Self-pay | Admitting: Physician Assistant

## 2018-04-10 ENCOUNTER — Other Ambulatory Visit: Payer: Self-pay

## 2018-04-10 ENCOUNTER — Ambulatory Visit (INDEPENDENT_AMBULATORY_CARE_PROVIDER_SITE_OTHER): Payer: BLUE CROSS/BLUE SHIELD | Admitting: Physician Assistant

## 2018-04-10 ENCOUNTER — Telehealth: Payer: Self-pay | Admitting: Physician Assistant

## 2018-04-10 DIAGNOSIS — F411 Generalized anxiety disorder: Secondary | ICD-10-CM

## 2018-04-10 MED ORDER — CLONAZEPAM 0.25 MG PO TBDP: 0.2500 mg | ORAL_TABLET | Freq: Two times a day (BID) | ORAL | 0 refills | Status: DC

## 2018-04-10 NOTE — Telephone Encounter (Signed)
Terri Ross wants to see her on either the 26th or 27th of December but her schedule isn't open.  Can you help me?  Thank you!!!

## 2018-04-10 NOTE — Patient Instructions (Addendum)
Start taking Klonopin 0.25 mg twice daily  Come back and see me Dec 26 or 27  Thank you for coming in today. I hope you feel we met your needs.  Feel free to call PCP if you have any questions or further requests.  Please consider signing up for MyChart if you do not already have it, as this is a great way to communicate with me.  Best,  Whitney McVey, PA-C   IF you received an x-ray today, you will receive an invoice from [facility]. Please contact [facility] at [phone] with questions or concerns regarding your invoice.   IF you received labwork today, you will receive an invoice from [facility]. Please contact [facility] at [phone] with questions or concerns regarding your invoice.   Our billing staff will not be able to assist you with questions regarding bills from these companies.  You will be contacted with the lab results as soon as they are available. The fastest way to get your results is to activate your My Chart account. Instructions are located on the last page of this paperwork. If you have not heard from [place] regarding the results in 2 weeks, please contact this office.

## 2018-04-10 NOTE — Progress Notes (Signed)
Terri Ross  MRN: [id number] DOB: 1990/08/05  PCP: Sebastian Ache, PA-C  Subjective:  Pt is a 27 year old female who presents to clinic for f/u anxiety. Last OV for this problem 03/13/2018, MOD questionnaire was negative. Started Klonopin 0.25mg  qd. "I don't twitch as much anymore, so it's kind of helping". Medication makes her sleepy. She is taking this before bed, once daily.   She's had 3 panic attacks this week She has not yet received phone call to sched appt with beh health  therapist - Cornelius Moras at [facility]. Hydroxyzine for sleep No longer taking xanax regularly.   She has tried: Effexor - mania Wellbutrin - did not sleep  Celexa - muscle twitching  Lexapro - "feels like a zombie" buspar - vertigo She has been on Lithium in high school (per pt)  Review of Systems  Gastrointestinal: Negative for nausea and vomiting.  Psychiatric/Behavioral: Positive for dysphoric mood. Negative for self-injury, sleep disturbance and suicidal ideas. The patient is nervous/anxious and is hyperactive.     Patient Active Problem List   Diagnosis Date Noted  . Rheumatoid arthritis (HCC) 07/28/2016  . Chronic tension-type headache, not intractable 07/26/2016  . Depression 02/04/2016  . GAD (generalized anxiety disorder) 02/04/2016  . Mild intermittent asthma 07/16/2013    Current Outpatient Medications on File Prior to Visit  Medication Sig Dispense Refill  . albuterol (PROVENTIL HFA;VENTOLIN HFA) 108 (90 Base) MCG/ACT inhaler INHALE 2 PUFFS EVERY 6 HOURS AS NEEDED FOR WHEEZING OR SHORTNESS OF BREATH 8.5 Inhaler 6  . ALPRAZolam (XANAX) 0.25 MG tablet Take 1-2 tablets (0.25-0.5 mg total) by mouth 2 (two) times daily as needed (panic attack). 20 tablet 1  . budesonide-formoterol (SYMBICORT) 80-4.5 MCG/ACT inhaler Inhale 2 puffs into the lungs 2 (two) times daily. 1 Inhaler 5  . clonazePAM (KLONOPIN) 0.25 MG disintegrating tablet Take 1 tablet (0.25 mg total) by mouth daily. May  increase to twice daily after 2 weeks if needed. 60 tablet 0  . hydrOXYzine (ATARAX/VISTARIL) 50 MG tablet TAKE 1 TO 2 TABLETS BY MOUTH EVERY 8 HOURS AS NEEDED FOR ANXIETY 30 tablet 0  . montelukast (SINGULAIR) 10 MG tablet Take 1 tablet (10 mg total) by mouth at bedtime. 30 tablet 3   No current facility-administered medications on file prior to visit.     Allergies  Allergen Reactions  . Voltaren [Diclofenac Sodium] Shortness Of Breath  . Doxycycline   . Lamotrigine Rash     Objective:  BP 120/83   Pulse 90   Temp 98.3 F (36.8 C) (Oral)   Resp 18   Ht 5' 2.5" (1.588 m)   Wt 199 lb (90.3 kg)   LMP 04/04/2018   SpO2 96%   BMI 35.82 kg/m   Physical Exam  Constitutional: She is oriented to person, place, and time. No distress.  Neurological: She is alert and oriented to person, place, and time.  Skin: Skin is warm and dry.  Psychiatric: Judgment normal.  Vitals reviewed.   Assessment and Plan :  1. Generalized anxiety disorder - Pt improving with klonopin 0.25 qd. Endorses "sleepiness" as a SE. Plan to increase to 0.25 bid. RTC in 3 weeks to recheck. Message sent to referral pool asking about beh health referral from last OV.  - clonazePAM (KLONOPIN) 0.25 MG disintegrating tablet; Take 1 tablet (0.25 mg total) by mouth 2 (two) times daily.  Dispense: 60 tablet; Refill: 0    Whitney Tashai Catino, PA-C  Primary Care at [facility] 04/10/2018 5:33 PM  Please note: Portions of this report may have been transcribed using dragon voice recognition software. Every effort was made to ensure accuracy; however, inadvertent computerized transcription errors may be present.

## 2018-04-11 ENCOUNTER — Telehealth: Payer: Self-pay | Admitting: Physician Assistant

## 2018-04-11 NOTE — Telephone Encounter (Signed)
Called and spoke with pt regarding McVey's request to see her on 12/26 or 12/27. I was able to schedule pt on 05/02/18 at 9:00 am. I advised pt of time, building and late policy. Pt acknowledged.

## 2018-04-17 DIAGNOSIS — F411 Generalized anxiety disorder: Secondary | ICD-10-CM | POA: Diagnosis not present

## 2018-04-24 DIAGNOSIS — F411 Generalized anxiety disorder: Secondary | ICD-10-CM | POA: Diagnosis not present

## 2018-05-02 ENCOUNTER — Ambulatory Visit: Payer: BLUE CROSS/BLUE SHIELD | Admitting: Physician Assistant

## 2018-05-02 ENCOUNTER — Other Ambulatory Visit: Payer: Self-pay

## 2018-05-02 ENCOUNTER — Telehealth: Payer: Self-pay | Admitting: Physician Assistant

## 2018-05-02 ENCOUNTER — Encounter: Payer: Self-pay | Admitting: Physician Assistant

## 2018-05-02 ENCOUNTER — Other Ambulatory Visit: Payer: Self-pay | Admitting: Physician Assistant

## 2018-05-02 DIAGNOSIS — F411 Generalized anxiety disorder: Secondary | ICD-10-CM

## 2018-05-02 DIAGNOSIS — F410 Panic disorder [episodic paroxysmal anxiety] without agoraphobia: Secondary | ICD-10-CM

## 2018-05-02 MED ORDER — HYDROXYZINE HCL 50 MG PO TABS: ORAL_TABLET | ORAL | 0 refills | Status: DC

## 2018-05-02 MED ORDER — ALPRAZOLAM 0.25 MG PO TABS: 0.2500 mg | ORAL_TABLET | Freq: Two times a day (BID) | ORAL | 1 refills | Status: DC | PRN

## 2018-05-02 NOTE — Telephone Encounter (Signed)
Requested medication (s) are due for refill today: yes  Requested medication (s) are on the active medication list: yes    Last refill: 02/14/18  #20  0 refills  Future visit scheduled yes  05/02/18 W. McVey  Notes to clinic: not delegated  Requested Prescriptions  Pending Prescriptions Disp Refills   ALPRAZolam (XANAX) 0.25 MG tablet [Pharmacy Med Name: ALPRAZOLAM 0.25 MG TABLET] 20 tablet 1    Sig: Take 1-2 tablets (0.25-0.5 mg total) by mouth 2 (two) times daily as needed (panic attack).     Not Delegated - Psychiatry:  Anxiolytics/Hypnotics Failed - 05/02/2018  8:22 AM      Failed - This refill cannot be delegated      Failed - Urine Drug Screen completed in last 360 days.      Passed - Valid encounter within last 6 months    Recent Outpatient Visits          3 weeks ago Generalized anxiety disorder   Primary Care at [facility], Madelaine Bhat, PA-C   1 month ago Generalized anxiety disorder   Primary Care at [facility], Madelaine Bhat, PA-C   2 months ago Episode of recurrent major depressive disorder, unspecified depression episode severity [facility])   Primary Care at [facility], Madelaine Bhat, PA-C   2 months ago Dental abscess   [facility] at [facility], Manus Rudd, MD   3 months ago Dental abscess   Primary Care at [facility], Madelaine Bhat, PA-C      Future Appointments            Today McVey, Madelaine Bhat, PA-C Primary Care at [place], [facility]

## 2018-05-02 NOTE — Telephone Encounter (Signed)
Pt. Needs appt. Per mcvey on 11th of January. A hold has been placed on pt's preferred time. 10:20am

## 2018-05-02 NOTE — Progress Notes (Signed)
Terri Ross  MRN: [id number] DOB: August 18, 1990  PCP: Sebastian Ache, PA-C  Subjective:  Pt is a 27 year old female who presents to clinic for f/u anxiety. Last OV for this problem 12/04, advised to increase klonopin 0.25 from qd to bid.  She ran out of her medications and did not have money to refill until yesterday. Plans to refill meds today.  She has not been sleeping well and has been "twitching" more often.   She plans to make an appt with [facility] soon.     therapist - Cornelius Mor[facility] at [facility]. Hydroxyzine for sleep No longer taking xanax regularly.   She has tried: Effexor - mania Wellbutrin -did not sleep Celexa -muscle twitching Lexapro -"feels like a zombie" buspar - vertigo She has been on Lithium in high school (per pt).   Review of Systems  Gastrointestinal: Negative for abdominal pain, nausea and vomiting.  Neurological: Positive for headaches. Negative for dizziness.  Psychiatric/Behavioral: Positive for dysphoric mood and sleep disturbance. Negative for self-injury and suicidal ideas. The patient is nervous/anxious.     Patient Active Problem List   Diagnosis Date Noted  . Rheumatoid arthritis (HCC) 07/28/2016  . Chronic tension-type headache, not intractable 07/26/2016  . Depression 02/04/2016  . GAD (generalized anxiety disorder) 02/04/2016  . Mild intermittent asthma 07/16/2013    Current Outpatient Medications on File Prior to Visit  Medication Sig Dispense Refill  . albuterol (PROVENTIL HFA;VENTOLIN HFA) 108 (90 Base) MCG/ACT inhaler INHALE 2 PUFFS EVERY 6 HOURS AS NEEDED FOR WHEEZING OR SHORTNESS OF BREATH 8.5 Inhaler 6  . ALPRAZolam (XANAX) 0.25 MG tablet Take 1-2 tablets (0.25-0.5 mg total) by mouth 2 (two) times daily as needed (panic attack). 20 tablet 1  . budesonide-formoterol (SYMBICORT) 80-4.5 MCG/ACT inhaler Inhale 2 puffs into the lungs 2 (two) times daily. 1 Inhaler 5  . clonazePAM (KLONOPIN) 0.25 MG  disintegrating tablet Take 1 tablet (0.25 mg total) by mouth 2 (two) times daily. 60 tablet 0  . hydrOXYzine (ATARAX/VISTARIL) 50 MG tablet TAKE 1 TO 2 TABLETS BY MOUTH EVERY 8 HOURS AS NEEDED FOR ANXIETY 30 tablet 0  . montelukast (SINGULAIR) 10 MG tablet Take 1 tablet (10 mg total) by mouth at bedtime. 30 tablet 3   No current facility-administered medications on file prior to visit.     Allergies  Allergen Reactions  . Voltaren [Diclofenac Sodium] Shortness Of Breath  . Doxycycline   . Lamotrigine Rash     Objective:  BP 121/81 (BP Location: Right Arm, Patient Position: Sitting, Cuff Size: Normal)   Pulse 85   Temp 98.5 F (36.9 C) (Oral)   Ht 5\' 2"  (1.575 m)   Wt 204 lb 3.2 oz (92.6 kg)   LMP 04/04/2018   SpO2 95%   BMI 37.35 kg/m   Physical Exam Vitals signs reviewed.  Constitutional:      Appearance: Normal appearance.  Neurological:     Mental Status: She is alert.  Psychiatric:        Mood and Affect: Mood normal.        Thought Content: Thought content normal.        Judgment: Judgment normal.     Assessment and Plan :  1. Panic attacks - pt has not yet increased Klonopin to bid due to not having money for any of her medications. RTC in 3-4 weeks to recheck once back on meds. She plans to sched appt with behavioral health soon.  - ALPRAZolam 04/06/2018)  0.25 MG tablet; Take 1-2 tablets (0.25-0.5 mg total) by mouth 2 (two) times daily as needed (panic attack).  Dispense: 20 tablet; Refill: 1    Whitney Mackenize Delgadillo, PA-C  Primary Care at [facility] 05/02/2018 9:27 AM  Please note: Portions of this report may have been transcribed using dragon voice recognition software. Every effort was made to ensure accuracy; however, inadvertent computerized transcription errors may be present.

## 2018-05-02 NOTE — Patient Instructions (Addendum)
  Take klonopin twice daily. Come back and check in with me in Jan (the 8th or 11th).    It has been my absolute pleasure taking part in caring for your health.  I am moving out of town with my fiance to start a new journey.  Please come back to [facility] as you see fit, as we have great providers who will take good care of you.  If you would prefer to follow one of our former lead PA's, Benny Lennert, you can find them at [facility] [address] [place], [place] [address] [phone].    IF you received an x-ray today, you will receive an invoice from [facility]. Please contact [facility] at [phone] with questions or concerns regarding your invoice.   IF you received labwork today, you will receive an invoice from [facility]. Please contact [facility] at [phone] with questions or concerns regarding your invoice.   Our billing staff will not be able to assist you with questions regarding bills from these companies.  You will be contacted with the lab results as soon as they are available. The fastest way to get your results is to activate your My Chart account. Instructions are located on the last page of this paperwork. If you have not heard from [place] regarding the results in 2 weeks, please contact this office.

## 2018-05-09 DIAGNOSIS — F411 Generalized anxiety disorder: Secondary | ICD-10-CM | POA: Diagnosis not present

## 2018-05-15 ENCOUNTER — Ambulatory Visit (INDEPENDENT_AMBULATORY_CARE_PROVIDER_SITE_OTHER): Payer: BLUE CROSS/BLUE SHIELD | Admitting: Physician Assistant

## 2018-05-15 ENCOUNTER — Encounter: Payer: Self-pay | Admitting: Physician Assistant

## 2018-05-15 ENCOUNTER — Other Ambulatory Visit: Payer: Self-pay

## 2018-05-15 VITALS — BP 138/97 | HR 80 | Temp 98.5°F | Resp 16 | Ht 62.0 in | Wt 202.2 lb

## 2018-05-15 DIAGNOSIS — F411 Generalized anxiety disorder: Secondary | ICD-10-CM | POA: Diagnosis not present

## 2018-05-15 DIAGNOSIS — J4531 Mild persistent asthma with (acute) exacerbation: Secondary | ICD-10-CM | POA: Diagnosis not present

## 2018-05-15 DIAGNOSIS — F339 Major depressive disorder, recurrent, unspecified: Secondary | ICD-10-CM | POA: Diagnosis not present

## 2018-05-15 MED ORDER — ALBUTEROL SULFATE HFA 108 (90 BASE) MCG/ACT IN AERS: INHALATION_SPRAY | RESPIRATORY_TRACT | 6 refills | Status: AC

## 2018-05-15 NOTE — Patient Instructions (Addendum)
Try calling behavioral health again!   Make an appointment with [facility], [place] [address] [phone]   Thank you for coming in today. I hope you feel we met your needs.  Feel free to call PCP if you have any questions or further requests.  Please consider signing up for MyChart if you do not already have it, as this is a great way to communicate with me.  Best,  [employer], PA-C
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
## 2018-05-24 DIAGNOSIS — F411 Generalized anxiety disorder: Secondary | ICD-10-CM | POA: Diagnosis not present

## 2018-05-29 DIAGNOSIS — F411 Generalized anxiety disorder: Secondary | ICD-10-CM | POA: Diagnosis not present

## 2018-06-05 DIAGNOSIS — F411 Generalized anxiety disorder: Secondary | ICD-10-CM | POA: Diagnosis not present

## 2018-06-14 ENCOUNTER — Ambulatory Visit (INDEPENDENT_AMBULATORY_CARE_PROVIDER_SITE_OTHER): Payer: BLUE CROSS/BLUE SHIELD

## 2018-06-14 ENCOUNTER — Ambulatory Visit: Payer: BLUE CROSS/BLUE SHIELD | Admitting: Family Medicine

## 2018-06-14 ENCOUNTER — Other Ambulatory Visit: Payer: Self-pay

## 2018-06-14 ENCOUNTER — Encounter: Payer: Self-pay | Admitting: Family Medicine

## 2018-06-14 VITALS — BP 135/82 | HR 85 | Temp 98.7°F | Ht 62.0 in | Wt 204.0 lb

## 2018-06-14 DIAGNOSIS — M79642 Pain in left hand: Secondary | ICD-10-CM | POA: Diagnosis not present

## 2018-06-14 DIAGNOSIS — M7989 Other specified soft tissue disorders: Secondary | ICD-10-CM | POA: Diagnosis not present

## 2018-06-14 DIAGNOSIS — M79641 Pain in right hand: Secondary | ICD-10-CM

## 2018-06-14 DIAGNOSIS — G5603 Carpal tunnel syndrome, bilateral upper limbs: Secondary | ICD-10-CM | POA: Diagnosis not present

## 2018-06-14 MED ORDER — GABAPENTIN 300 MG PO CAPS: 300.0000 mg | ORAL_CAPSULE | Freq: Three times a day (TID) | ORAL | 0 refills | Status: DC

## 2018-06-14 NOTE — Progress Notes (Signed)
2/7/202010:23 AM  Terri Ross 07/09/1990, 28 y.o. female [id number]  Chief Complaint  Patient presents with  . Pain    pain in  both hand due to arthritis    HPI:   Patient is a 28 y.o. female with past medical history significant for GAD, asthma who presents today for bilateral hand pain  She reports having rheumatoid arthritis since childhood Reports hardly every gets flare up Hands started swelling and painful for about 2 weeks Has mild tingling Patient denies any fever, chills, malaise Patient very needle phobic Right handed nsaids trigger her asthma  Fall Risk  06/14/2018 05/15/2018 05/02/2018 04/10/2018 03/13/2018  Falls in the past year? 0 0 0 0 0  Number falls in past yr: 0 - - - -  Injury with Fall? 0 - - - -     Depression screen [facility] 2/9 06/14/2018 05/15/2018 05/02/2018  Decreased Interest 0 3 3  Down, Depressed, Hopeless 0 3 3  PHQ - 2 Score 0 6 6  Altered sleeping - 3 3  Tired, decreased energy - 3 3  Change in appetite - 3 3  Feeling bad or failure about yourself  - 3 3  Trouble concentrating - 3 3  Moving slowly or fidgety/restless - 1 0  Suicidal thoughts - 0 0  PHQ-9 Score - 22 21  Difficult doing work/chores - Extremely dIfficult Very difficult  Some recent data might be hidden    Allergies  Allergen Reactions  . Voltaren [Diclofenac Sodium] Shortness Of Breath  . Doxycycline   . Lamotrigine Rash    Prior to Admission medications   Medication Sig Start Date End Date Taking? Authorizing Provider  albuterol (PROVENTIL HFA;VENTOLIN HFA) 108 (90 Base) MCG/ACT inhaler INHALE 2 PUFFS EVERY 6 HOURS AS NEEDED FOR WHEEZING OR SHORTNESS OF BREATH 05/15/18  Yes McVey, Madelaine Bhat, PA-C  ALPRAZolam (XANAX) 0.25 MG tablet Take 1-2 tablets (0.25-0.5 mg total) by mouth 2 (two) times daily as needed (panic attack). 05/02/18  Yes McVey, Madelaine Bhat, PA-C  budesonide-formoterol (SYMBICORT) 80-4.5 MCG/ACT inhaler Inhale 2 puffs into the lungs 2 (two) times  daily. 11/14/17  Yes McVey, Madelaine Bhat, PA-C  clonazePAM (KLONOPIN) 0.25 MG disintegrating tablet Take 1 tablet (0.25 mg total) by mouth 2 (two) times daily. 04/10/18  Yes McVey, Madelaine Bhat, PA-C  hydrOXYzine (ATARAX/VISTARIL) 50 MG tablet TAKE 1 TO 2 TABLETS BY MOUTH EVERY 8 HOURS AS NEEDED FOR ANXIETY 05/02/18  Yes McVey, Madelaine Bhat, PA-C  hydrOXYzine (ATARAX/VISTARIL) 50 MG tablet TAKE 1 TO 2 TABLETS BY MOUTH EVERY 8 HOURS AS NEEDED FOR ANXIETY 05/02/18  Yes McVey, Madelaine Bhat, PA-C  montelukast (SINGULAIR) 10 MG tablet Take 1 tablet (10 mg total) by mouth at bedtime. 10/16/17  Yes McVey, Madelaine Bhat, PA-C    Past Medical History:  Diagnosis Date  . Anxiety   . Arthritis   . Asthma   . Rheumatoid arthritis [facility])     Past Surgical History:  Procedure Laterality Date  . NO PAST SURGERIES      Social History   Tobacco Use  . Smoking status: Never Smoker  . Smokeless tobacco: Never Used  Substance Use Topics  . Alcohol use: Yes    Comment: occ    Family History  Problem Relation Age of Onset  . Hypertension Father   . Anxiety disorder Father   . Asthma Sister   . Arthritis Sister   . Schizophrenia Sister   . Cancer Maternal Grandmother   . Cancer Paternal  Grandmother        lung, POSITIVE TOBACCO    ROS   OBJECTIVE:  Blood pressure 135/82, pulse 85, temperature 98.7 F (37.1 C), temperature source Oral, height 5\' 2"  (1.575 m), weight 204 lb (92.5 kg), last menstrual period 05/29/2018, SpO2 94 %. Body mass index is 37.31 kg/m.   Physical Exam   Gen: AAOx3, NAD Hands: no swelling, no synovitis, FROM + tinel and phalen, bilaterally NVI   Dg Hand Complete Left  Result Date: 06/14/2018 CLINICAL DATA:  Bilateral hand pain.  Rheumatoid arthritis. EXAM: LEFT HAND - COMPLETE 3+ VIEW; RIGHT HAND - COMPLETE 3+ VIEW COMPARISON:  None. FINDINGS: Mild periarticular osteopenia most notable at the MCP joints. No DIP or PIP joint degenerative  changes. The MCP joints are maintained.  No erosive changes. The radiocarpal and intercarpal joint spaces are maintained. No erosive findings or chondrocalcinosis. IMPRESSION: Periarticular osteopenia but no degenerative or erosive findings. Electronically Signed   By: Rudie Meyer M.D.   On: 06/14/2018 10:46   Dg Hand Complete Right  Result Date: 06/14/2018 CLINICAL DATA:  Bilateral hand pain.  Rheumatoid arthritis. EXAM: LEFT HAND - COMPLETE 3+ VIEW; RIGHT HAND - COMPLETE 3+ VIEW COMPARISON:  None. FINDINGS: Mild periarticular osteopenia most notable at the MCP joints. No DIP or PIP joint degenerative changes. The MCP joints are maintained.  No erosive changes. The radiocarpal and intercarpal joint spaces are maintained. No erosive findings or chondrocalcinosis. IMPRESSION: Periarticular osteopenia but no degenerative or erosive findings. Electronically Signed   By: Rudie Meyer M.D.   On: 06/14/2018 10:46    ASSESSMENT and PLAN  1. Bilateral carpal tunnel syndrome Discussed supportive measures, new meds r/se/b and RTC precautions. Patient educational handout given. - Splint wrist  2. Bilateral hand swelling - DG Hand Complete Left; Future - DG Hand Complete Right; Future  Other orders - gabapentin (NEURONTIN) 300 MG capsule; Take 1 capsule (300 mg total) by mouth 3 (three) times daily.  Return if symptoms worsen or fail to improve.    Myles Lipps, MD Primary Care at [facility] [address] [place], [place] [address] Ph.  [phone] Fax [phone]

## 2018-06-14 NOTE — Patient Instructions (Addendum)
If you have lab work done today you will be contacted with your lab results within the next 2 weeks.  If you have not heard from [place] then please contact us. The fastest way to get your results is to register for My Chart.   IF you received an x-ray today, you will receive an invoice from [facility]. Please contact [facility] at [phone] with questions or concerns regarding your invoice.   IF you received labwork today, you will receive an invoice from [facility]. Please contact [facility] at [phone] with questions or concerns regarding your invoice.   Our billing staff will not be able to assist you with questions regarding bills from these companies.  You will be contacted with the lab results as soon as they are available. The fastest way to get your results is to activate your My Chart account. Instructions are located on the last page of this paperwork. If you have not heard from [place] regarding the results in 2 weeks, please contact this office.     Carpal Tunnel Syndrome  Carpal tunnel syndrome is a condition that causes pain in your hand and arm. The carpal tunnel is a narrow area that is on the palm side of your wrist. Repeated wrist motion or certain diseases may cause swelling in the tunnel. This swelling can pinch the main nerve in the wrist (median nerve). What are the causes? This condition may be caused by:  Repeated wrist motions.  Wrist injuries.  Arthritis.  A sac of fluid (cyst) or abnormal growth (tumor) in the carpal tunnel.  Fluid buildup during pregnancy. Sometimes the cause is not known. What increases the risk? The following factors may make you more likely to develop this condition:  Having a job in which you move your wrist in the same way many times. This includes jobs like being a Software engineer or a Scientist, water quality.  Being a woman.  Having other health conditions, such as: ? Diabetes. ? Obesity. ? A thyroid gland that is not active  enough (hypothyroidism). ? Kidney failure. What are the signs or symptoms? Symptoms of this condition include:  A tingling feeling in your fingers.  Tingling or a loss of feeling (numbness) in your hand.  Pain in your entire arm. This pain may get worse when you bend your wrist and elbow for a long time.  Pain in your wrist that goes up your arm to your shoulder.  Pain that goes down into your palm or fingers.  A weak feeling in your hands. You may find it hard to grab and hold items. You may feel worse at night. How is this diagnosed? This condition is diagnosed with a medical history and physical exam. You may also have tests, such as:  Electromyogram (EMG). This test checks the signals that the nerves send to the muscles.  Nerve conduction study. This test checks how well signals pass through your nerves.  Imaging tests, such as X-rays, ultrasound, and MRI. These tests check for what might be the cause of your condition. How is this treated? This condition may be treated with:  Lifestyle changes. You will be asked to stop or change the activity that caused your problem.  Doing exercise and activities that make bones and muscles stronger (physical therapy).  Learning how to use your hand again (occupational therapy).  Medicines for pain and swelling (inflammation). You may have injections in your wrist.  A wrist splint.  Surgery. Follow these instructions at home: If  you have a splint:  Wear the splint as told by your doctor. Remove it only as told by your doctor.  Loosen the splint if your fingers: ? Tingle. ? Lose feeling (become numb). ? Turn cold and blue.  Keep the splint clean.  If the splint is not waterproof: ? Do not let it get wet. ? Cover it with a watertight covering when you take a bath or a shower. Managing pain, stiffness, and swelling   If told, put ice on the painful area: ? If you have a removable splint, remove it as told by your  doctor. ? Put ice in a plastic bag. ? Place a towel between your skin and the bag. ? Leave the ice on for 20 minutes, 2-3 times per day. General instructions  Take over-the-counter and prescription medicines only as told by your doctor.  Rest your wrist from any activity that may cause pain. If needed, talk with your boss at work about changes that can help your wrist heal.  Do any exercises as told by your doctor, physical therapist, or occupational therapist.  Keep all follow-up visits as told by your doctor. This is important. Contact a doctor if:  You have new symptoms.  Medicine does not help your pain.  Your symptoms get worse. Get help right away if:  You have very bad numbness or tingling in your wrist or hand. Summary  Carpal tunnel syndrome is a condition that causes pain in your hand and arm.  It is often caused by repeated wrist motions.  Lifestyle changes and medicines are used to treat this problem. Surgery may help in very bad cases.  Follow your doctor's instructions about wearing a splint, resting your wrist, keeping follow-up visits, and calling for help. This information is not intended to replace advice given to you by your health care provider. Make sure you discuss any questions you have with your health care provider. Document Released: 04/13/2011 Document Revised: 08/31/2017 Document Reviewed: 08/31/2017 Elsevier Interactive Patient Education  2019 [employer].

## 2018-06-19 DIAGNOSIS — F411 Generalized anxiety disorder: Secondary | ICD-10-CM | POA: Diagnosis not present

## 2018-06-21 ENCOUNTER — Other Ambulatory Visit: Payer: Self-pay

## 2018-06-21 ENCOUNTER — Encounter: Payer: Self-pay | Admitting: Family Medicine

## 2018-06-21 ENCOUNTER — Ambulatory Visit: Payer: BLUE CROSS/BLUE SHIELD | Admitting: Family Medicine

## 2018-06-21 VITALS — BP 112/79 | HR 120 | Temp 99.6°F | Resp 16 | Ht 62.0 in | Wt 198.0 lb

## 2018-06-21 DIAGNOSIS — J0140 Acute pansinusitis, unspecified: Secondary | ICD-10-CM

## 2018-06-21 DIAGNOSIS — J4541 Moderate persistent asthma with (acute) exacerbation: Secondary | ICD-10-CM

## 2018-06-21 DIAGNOSIS — R509 Fever, unspecified: Secondary | ICD-10-CM | POA: Diagnosis not present

## 2018-06-21 LAB — POCT INFLUENZA A/B
Influenza A, POC: NEGATIVE
Influenza B, POC: NEGATIVE

## 2018-06-21 MED ORDER — PREDNISONE 20 MG PO TABS: 40.0000 mg | ORAL_TABLET | Freq: Every day | ORAL | 0 refills | Status: DC

## 2018-06-21 MED ORDER — AMOXICILLIN-POT CLAVULANATE 875-125 MG PO TABS: 1.0000 | ORAL_TABLET | Freq: Two times a day (BID) | ORAL | 0 refills | Status: DC

## 2018-06-21 NOTE — Progress Notes (Signed)
2/14/202010:57 AM  Terri Ross 12-Jul-1990, 28 y.o. female [id number]  Chief Complaint  Patient presents with  . Sinus Problem    pt states she has been having nasal drainage, congestion, cough  . Fever    with some fatigue     HPI:   Patient is a 28 y.o. female with past medical history significant for asthma, seasonal allergies, anxiety who presents today for nasal congestion, drainage, cough, sinus headache, clogged ears, sneezing, fever and fatigue x 5 days  Has been taking mucinex- D, tylenol Does not take daily allergy meds, has a high performance hepa filter, usually has no issues H/o pneumonia  Does not get flu vaccine Does not do nasal sprays  Having to use albuterol more, multiple times a day Having intermittent sob with wheezing  Fall Risk  06/21/2018 06/14/2018 05/15/2018 05/02/2018 04/10/2018  Falls in the past year? 0 0 0 0 0  Number falls in past yr: - 0 - - -  Injury with Fall? 0 0 - - -     Depression screen [facility] 2/9 06/21/2018 06/14/2018 05/15/2018  Decreased Interest 0 0 3  Down, Depressed, Hopeless 0 0 3  PHQ - 2 Score 0 0 6  Altered sleeping - - 3  Tired, decreased energy - - 3  Change in appetite - - 3  Feeling bad or failure about yourself  - - 3  Trouble concentrating - - 3  Moving slowly or fidgety/restless - - 1  Suicidal thoughts - - 0  PHQ-9 Score - - 22  Difficult doing work/chores - - Extremely dIfficult  Some recent data might be hidden    Allergies  Allergen Reactions  . Voltaren [Diclofenac Sodium] Shortness Of Breath  . Doxycycline   . Lamotrigine Rash    Prior to Admission medications   Medication Sig Start Date End Date Taking? Authorizing Provider  albuterol (PROVENTIL HFA;VENTOLIN HFA) 108 (90 Base) MCG/ACT inhaler INHALE 2 PUFFS EVERY 6 HOURS AS NEEDED FOR WHEEZING OR SHORTNESS OF BREATH 05/15/18  Yes McVey, Madelaine Bhat, PA-C  ALPRAZolam (XANAX) 0.25 MG tablet Take 1-2 tablets (0.25-0.5 mg total) by mouth 2 (two) times  daily as needed (panic attack). 05/02/18  Yes McVey, Madelaine Bhat, PA-C  budesonide-formoterol (SYMBICORT) 80-4.5 MCG/ACT inhaler Inhale 2 puffs into the lungs 2 (two) times daily. 11/14/17  Yes McVey, Madelaine Bhat, PA-C  clonazePAM (KLONOPIN) 0.25 MG disintegrating tablet Take 1 tablet (0.25 mg total) by mouth 2 (two) times daily. 04/10/18  Yes McVey, Madelaine Bhat, PA-C  gabapentin (NEURONTIN) 300 MG capsule Take 1 capsule (300 mg total) by mouth 3 (three) times daily. 06/14/18  Yes Myles Lipps, MD  hydrOXYzine (ATARAX/VISTARIL) 50 MG tablet TAKE 1 TO 2 TABLETS BY MOUTH EVERY 8 HOURS AS NEEDED FOR ANXIETY 05/02/18  Yes McVey, Madelaine Bhat, PA-C  montelukast (SINGULAIR) 10 MG tablet Take 1 tablet (10 mg total) by mouth at bedtime. 10/16/17  Yes McVey, Madelaine Bhat, PA-C    Past Medical History:  Diagnosis Date  . Anxiety   . Arthritis   . Asthma   . Rheumatoid arthritis [facility])     Past Surgical History:  Procedure Laterality Date  . NO PAST SURGERIES      Social History   Tobacco Use  . Smoking status: Never Smoker  . Smokeless tobacco: Never Used  Substance Use Topics  . Alcohol use: Yes    Comment: occ    Family History  Problem Relation Age of Onset  . Hypertension  Father   . Anxiety disorder Father   . Asthma Sister   . Arthritis Sister   . Schizophrenia Sister   . Cancer Maternal Grandmother   . Cancer Paternal Grandmother        lung, POSITIVE TOBACCO    ROS Per hpi  OBJECTIVE:  Blood pressure 112/79, pulse (!) 120, temperature 99.6 F (37.6 C), temperature source Oral, resp. rate 16, height 5\' 2"  (1.575 m), weight 198 lb (89.8 kg), last menstrual period 05/29/2018, SpO2 94 %. Body mass index is 36.21 kg/m.   Physical Exam Vitals signs and nursing note reviewed.  Constitutional:      Appearance: She is well-developed. She is ill-appearing.  HENT:     Head: Normocephalic and atraumatic.     Right Ear: Hearing, tympanic membrane,  ear canal and external ear normal.     Left Ear: Hearing, tympanic membrane, ear canal and external ear normal.  Eyes:     Conjunctiva/sclera: Conjunctivae normal.     Pupils: Pupils are equal, round, and reactive to light.  Neck:     Musculoskeletal: Neck supple.  Cardiovascular:     Rate and Rhythm: Normal rate and regular rhythm.     Heart sounds: Normal heart sounds. No murmur. No friction rub. No gallop.   Pulmonary:     Effort: Pulmonary effort is normal.     Breath sounds: Wheezing present. No rhonchi or rales.  Lymphadenopathy:     Cervical: No cervical adenopathy.  Skin:    General: Skin is warm and dry.  Neurological:     Mental Status: She is alert and oriented to person, place, and time.  Psychiatric:        Mood and Affect: Mood is anxious.     Results for orders placed or performed in visit on 06/21/18 (from the past 24 hour(s))  POCT Influenza A/B     Status: None   Collection Time: 06/21/18 11:07 AM  Result Value Ref Range   Influenza A, POC Negative Negative   Influenza B, POC Negative Negative    ASSESSMENT and PLAN  1. Acute non-recurrent pansinusitis 2. Moderate persistent asthma with acute exacerbation Discussed supportive measures, new meds r/se/b and RTC precautions.  3. Fever, unspecified fever cause - POCT Influenza A/B  Other orders - amoxicillin-clavulanate (AUGMENTIN) 875-125 MG tablet; Take 1 tablet by mouth 2 (two) times daily. - predniSONE (DELTASONE) 20 MG tablet; Take 2 tablets (40 mg total) by mouth daily with breakfast.    Return in about 1 week (around 06/28/2018) for asthma exacerbation.    Myles Lipps, MD Primary Care at [facility] [address] [place], [place] [address] Ph.  [phone] Fax [phone]

## 2018-06-21 NOTE — Patient Instructions (Signed)
° ° ° °  If you have lab work done today you will be contacted with your lab results within the next 2 weeks.  If you have not heard from us then please contact us. The fastest way to get your results is to register for My Chart. ° ° °IF you received an x-ray today, you will receive an invoice from [facility]. Please contact [facility] at [phone] with questions or concerns regarding your invoice.  ° °IF you received labwork today, you will receive an invoice from [facility]. Please contact [facility] at [phone] with questions or concerns regarding your invoice.  ° °Our billing staff will not be able to assist you with questions regarding bills from these companies. ° °You will be contacted with the lab results as soon as they are available. The fastest way to get your results is to activate your My Chart account. Instructions are located on the last page of this paperwork. If you have not heard from us regarding the results in 2 weeks, please contact this office. °  ° ° ° °

## 2018-06-26 DIAGNOSIS — F411 Generalized anxiety disorder: Secondary | ICD-10-CM | POA: Diagnosis not present

## 2018-06-28 ENCOUNTER — Ambulatory Visit: Payer: BLUE CROSS/BLUE SHIELD | Admitting: Family Medicine

## 2018-07-10 DIAGNOSIS — F411 Generalized anxiety disorder: Secondary | ICD-10-CM | POA: Diagnosis not present

## 2018-07-17 DIAGNOSIS — F411 Generalized anxiety disorder: Secondary | ICD-10-CM | POA: Diagnosis not present

## 2018-07-24 DIAGNOSIS — F411 Generalized anxiety disorder: Secondary | ICD-10-CM | POA: Diagnosis not present

## 2018-07-31 DIAGNOSIS — F411 Generalized anxiety disorder: Secondary | ICD-10-CM | POA: Diagnosis not present

## 2018-08-05 ENCOUNTER — Ambulatory Visit: Payer: BLUE CROSS/BLUE SHIELD | Admitting: Family Medicine

## 2018-08-07 DIAGNOSIS — F411 Generalized anxiety disorder: Secondary | ICD-10-CM | POA: Diagnosis not present

## 2018-08-14 DIAGNOSIS — F411 Generalized anxiety disorder: Secondary | ICD-10-CM | POA: Diagnosis not present

## 2018-08-21 DIAGNOSIS — F411 Generalized anxiety disorder: Secondary | ICD-10-CM | POA: Diagnosis not present

## 2018-08-28 DIAGNOSIS — F411 Generalized anxiety disorder: Secondary | ICD-10-CM | POA: Diagnosis not present

## 2018-09-04 DIAGNOSIS — F411 Generalized anxiety disorder: Secondary | ICD-10-CM | POA: Diagnosis not present

## 2018-09-11 DIAGNOSIS — F411 Generalized anxiety disorder: Secondary | ICD-10-CM | POA: Diagnosis not present

## 2018-09-18 DIAGNOSIS — F411 Generalized anxiety disorder: Secondary | ICD-10-CM | POA: Diagnosis not present

## 2018-09-25 DIAGNOSIS — F411 Generalized anxiety disorder: Secondary | ICD-10-CM | POA: Diagnosis not present

## 2018-10-02 DIAGNOSIS — F411 Generalized anxiety disorder: Secondary | ICD-10-CM | POA: Diagnosis not present

## 2018-10-09 DIAGNOSIS — F411 Generalized anxiety disorder: Secondary | ICD-10-CM | POA: Diagnosis not present

## 2018-10-16 DIAGNOSIS — F411 Generalized anxiety disorder: Secondary | ICD-10-CM | POA: Diagnosis not present

## 2018-10-23 DIAGNOSIS — F411 Generalized anxiety disorder: Secondary | ICD-10-CM | POA: Diagnosis not present

## 2018-10-30 DIAGNOSIS — F411 Generalized anxiety disorder: Secondary | ICD-10-CM | POA: Diagnosis not present

## 2018-11-06 DIAGNOSIS — F411 Generalized anxiety disorder: Secondary | ICD-10-CM | POA: Diagnosis not present

## 2018-11-13 DIAGNOSIS — F411 Generalized anxiety disorder: Secondary | ICD-10-CM | POA: Diagnosis not present

## 2018-11-20 DIAGNOSIS — F411 Generalized anxiety disorder: Secondary | ICD-10-CM | POA: Diagnosis not present

## 2018-11-26 DIAGNOSIS — F410 Panic disorder [episodic paroxysmal anxiety] without agoraphobia: Secondary | ICD-10-CM | POA: Diagnosis not present

## 2018-11-26 DIAGNOSIS — E6609 Other obesity due to excess calories: Secondary | ICD-10-CM | POA: Diagnosis not present

## 2018-11-26 DIAGNOSIS — Z6837 Body mass index (BMI) 37.0-37.9, adult: Secondary | ICD-10-CM | POA: Diagnosis not present

## 2018-11-26 DIAGNOSIS — F4310 Post-traumatic stress disorder, unspecified: Secondary | ICD-10-CM | POA: Diagnosis not present

## 2018-11-26 DIAGNOSIS — E039 Hypothyroidism, unspecified: Secondary | ICD-10-CM | POA: Diagnosis not present

## 2018-11-26 DIAGNOSIS — F411 Generalized anxiety disorder: Secondary | ICD-10-CM | POA: Diagnosis not present

## 2018-11-27 DIAGNOSIS — F411 Generalized anxiety disorder: Secondary | ICD-10-CM | POA: Diagnosis not present

## 2018-12-04 DIAGNOSIS — F411 Generalized anxiety disorder: Secondary | ICD-10-CM | POA: Diagnosis not present

## 2018-12-10 DIAGNOSIS — F411 Generalized anxiety disorder: Secondary | ICD-10-CM | POA: Diagnosis not present

## 2018-12-10 DIAGNOSIS — F4310 Post-traumatic stress disorder, unspecified: Secondary | ICD-10-CM | POA: Diagnosis not present

## 2018-12-10 DIAGNOSIS — M62838 Other muscle spasm: Secondary | ICD-10-CM | POA: Diagnosis not present

## 2018-12-10 DIAGNOSIS — F410 Panic disorder [episodic paroxysmal anxiety] without agoraphobia: Secondary | ICD-10-CM | POA: Diagnosis not present

## 2018-12-11 DIAGNOSIS — F411 Generalized anxiety disorder: Secondary | ICD-10-CM | POA: Diagnosis not present

## 2018-12-18 DIAGNOSIS — F411 Generalized anxiety disorder: Secondary | ICD-10-CM | POA: Diagnosis not present

## 2018-12-25 DIAGNOSIS — F411 Generalized anxiety disorder: Secondary | ICD-10-CM | POA: Diagnosis not present

## 2018-12-27 DIAGNOSIS — F410 Panic disorder [episodic paroxysmal anxiety] without agoraphobia: Secondary | ICD-10-CM | POA: Diagnosis not present

## 2018-12-27 DIAGNOSIS — F4310 Post-traumatic stress disorder, unspecified: Secondary | ICD-10-CM | POA: Diagnosis not present

## 2018-12-27 DIAGNOSIS — F411 Generalized anxiety disorder: Secondary | ICD-10-CM | POA: Diagnosis not present

## 2018-12-27 DIAGNOSIS — M62838 Other muscle spasm: Secondary | ICD-10-CM | POA: Diagnosis not present

## 2019-01-08 DIAGNOSIS — F411 Generalized anxiety disorder: Secondary | ICD-10-CM | POA: Diagnosis not present

## 2019-01-14 DIAGNOSIS — F411 Generalized anxiety disorder: Secondary | ICD-10-CM | POA: Diagnosis not present

## 2019-01-14 DIAGNOSIS — N926 Irregular menstruation, unspecified: Secondary | ICD-10-CM | POA: Diagnosis not present

## 2019-01-14 DIAGNOSIS — F410 Panic disorder [episodic paroxysmal anxiety] without agoraphobia: Secondary | ICD-10-CM | POA: Diagnosis not present

## 2019-01-15 DIAGNOSIS — F411 Generalized anxiety disorder: Secondary | ICD-10-CM | POA: Diagnosis not present

## 2019-01-22 DIAGNOSIS — F411 Generalized anxiety disorder: Secondary | ICD-10-CM | POA: Diagnosis not present

## 2019-01-24 DIAGNOSIS — J4520 Mild intermittent asthma, uncomplicated: Secondary | ICD-10-CM | POA: Diagnosis not present

## 2019-01-24 DIAGNOSIS — F411 Generalized anxiety disorder: Secondary | ICD-10-CM | POA: Diagnosis not present

## 2019-01-24 DIAGNOSIS — F321 Major depressive disorder, single episode, moderate: Secondary | ICD-10-CM | POA: Diagnosis not present

## 2019-01-24 DIAGNOSIS — F410 Panic disorder [episodic paroxysmal anxiety] without agoraphobia: Secondary | ICD-10-CM | POA: Diagnosis not present

## 2019-01-29 DIAGNOSIS — F411 Generalized anxiety disorder: Secondary | ICD-10-CM | POA: Diagnosis not present

## 2019-02-05 DIAGNOSIS — F411 Generalized anxiety disorder: Secondary | ICD-10-CM | POA: Diagnosis not present

## 2019-02-07 DIAGNOSIS — F4310 Post-traumatic stress disorder, unspecified: Secondary | ICD-10-CM | POA: Diagnosis not present

## 2019-02-07 DIAGNOSIS — F411 Generalized anxiety disorder: Secondary | ICD-10-CM | POA: Diagnosis not present

## 2019-02-07 DIAGNOSIS — F410 Panic disorder [episodic paroxysmal anxiety] without agoraphobia: Secondary | ICD-10-CM | POA: Diagnosis not present

## 2019-02-07 DIAGNOSIS — F321 Major depressive disorder, single episode, moderate: Secondary | ICD-10-CM | POA: Diagnosis not present

## 2019-02-12 DIAGNOSIS — F411 Generalized anxiety disorder: Secondary | ICD-10-CM | POA: Diagnosis not present

## 2019-02-19 DIAGNOSIS — F411 Generalized anxiety disorder: Secondary | ICD-10-CM | POA: Diagnosis not present

## 2019-02-21 ENCOUNTER — Other Ambulatory Visit: Payer: Self-pay

## 2019-02-21 DIAGNOSIS — T887XXA Unspecified adverse effect of drug or medicament, initial encounter: Secondary | ICD-10-CM | POA: Diagnosis not present

## 2019-02-21 DIAGNOSIS — F411 Generalized anxiety disorder: Secondary | ICD-10-CM | POA: Diagnosis not present

## 2019-02-21 DIAGNOSIS — F321 Major depressive disorder, single episode, moderate: Secondary | ICD-10-CM | POA: Diagnosis not present

## 2019-02-21 DIAGNOSIS — Z20828 Contact with and (suspected) exposure to other viral communicable diseases: Secondary | ICD-10-CM | POA: Diagnosis not present

## 2019-02-21 DIAGNOSIS — Z20822 Contact with and (suspected) exposure to covid-19: Secondary | ICD-10-CM

## 2019-02-21 DIAGNOSIS — F4310 Post-traumatic stress disorder, unspecified: Secondary | ICD-10-CM | POA: Diagnosis not present

## 2019-02-23 LAB — NOVEL CORONAVIRUS, NAA: SARS-CoV-2, NAA: NOT DETECTED

## 2019-02-26 ENCOUNTER — Other Ambulatory Visit: Payer: Self-pay

## 2019-02-26 DIAGNOSIS — Z20822 Contact with and (suspected) exposure to covid-19: Secondary | ICD-10-CM

## 2019-02-26 DIAGNOSIS — F411 Generalized anxiety disorder: Secondary | ICD-10-CM | POA: Diagnosis not present

## 2019-02-27 LAB — NOVEL CORONAVIRUS, NAA: SARS-CoV-2, NAA: NOT DETECTED

## 2019-03-05 DIAGNOSIS — F411 Generalized anxiety disorder: Secondary | ICD-10-CM | POA: Diagnosis not present

## 2019-03-12 DIAGNOSIS — R12 Heartburn: Secondary | ICD-10-CM | POA: Diagnosis not present

## 2019-03-12 DIAGNOSIS — R112 Nausea with vomiting, unspecified: Secondary | ICD-10-CM | POA: Diagnosis not present

## 2019-03-12 DIAGNOSIS — F411 Generalized anxiety disorder: Secondary | ICD-10-CM | POA: Diagnosis not present

## 2019-03-16 ENCOUNTER — Emergency Department (HOSPITAL_COMMUNITY): Payer: BC Managed Care – PPO

## 2019-03-16 ENCOUNTER — Emergency Department (HOSPITAL_COMMUNITY)
Admission: EM | Admit: 2019-03-16 | Discharge: 2019-03-16 | Disposition: A | Discharge status: Home / Self Care | Payer: BC Managed Care – PPO | Attending: Emergency Medicine | Admitting: Emergency Medicine

## 2019-03-16 ENCOUNTER — Encounter (HOSPITAL_COMMUNITY): Payer: Self-pay

## 2019-03-16 ENCOUNTER — Other Ambulatory Visit: Payer: Self-pay

## 2019-03-16 DIAGNOSIS — F419 Anxiety disorder, unspecified: Secondary | ICD-10-CM | POA: Insufficient documentation

## 2019-03-16 DIAGNOSIS — Z79899 Other long term (current) drug therapy: Secondary | ICD-10-CM | POA: Insufficient documentation

## 2019-03-16 DIAGNOSIS — R0602 Shortness of breath: Secondary | ICD-10-CM | POA: Diagnosis not present

## 2019-03-16 DIAGNOSIS — J45909 Unspecified asthma, uncomplicated: Secondary | ICD-10-CM | POA: Diagnosis not present

## 2019-03-16 DIAGNOSIS — M069 Rheumatoid arthritis, unspecified: Secondary | ICD-10-CM | POA: Diagnosis not present

## 2019-03-16 DIAGNOSIS — R0789 Other chest pain: Secondary | ICD-10-CM | POA: Diagnosis not present

## 2019-03-16 LAB — CBC
HCT: 46.6 % — ABNORMAL HIGH (ref 36.0–46.0)
Hemoglobin: 15.3 g/dL — ABNORMAL HIGH (ref 12.0–15.0)
MCH: 28.3 pg (ref 26.0–34.0)
MCHC: 32.8 g/dL (ref 30.0–36.0)
MCV: 86.3 fL (ref 80.0–100.0)
Platelets: 349 10*3/uL (ref 150–400)
RBC: 5.4 MIL/uL — ABNORMAL HIGH (ref 3.87–5.11)
RDW: 11.3 % — ABNORMAL LOW (ref 11.5–15.5)
WBC: 11.2 10*3/uL — ABNORMAL HIGH (ref 4.0–10.5)
nRBC: 0 % (ref 0.0–0.2)

## 2019-03-16 LAB — BASIC METABOLIC PANEL
Anion gap: 11 (ref 5–15)
BUN: 7 mg/dL (ref 6–20)
CO2: 20 mmol/L — ABNORMAL LOW (ref 22–32)
Calcium: 9.1 mg/dL (ref 8.9–10.3)
Chloride: 106 mmol/L (ref 98–111)
Creatinine, Ser: 1.05 mg/dL — ABNORMAL HIGH (ref 0.44–1.00)
GFR calc Af Amer: >60 mL/min (ref >60)
GFR calc non Af Amer: >60 mL/min (ref >60)
Glucose, Bld: 94 mg/dL (ref 70–99)
Potassium: 4.1 mmol/L (ref 3.5–5.1)
Sodium: 137 mmol/L (ref 135–145)

## 2019-03-16 LAB — D-DIMER, QUANTITATIVE: D-Dimer, Quant: <0.27 ug/mL-FEU (ref 0.00–0.50)

## 2019-03-16 LAB — I-STAT BETA HCG BLOOD, ED (MC, WL, AP ONLY): I-stat hCG, quantitative: <5 m[IU]/mL (ref <5)

## 2019-03-16 MED ORDER — PREDNISONE 20 MG PO TABS: 40.0000 mg | ORAL_TABLET | Freq: Every day | ORAL | 0 refills | Status: DC

## 2019-03-16 MED ORDER — SODIUM CHLORIDE 0.9 % IV BOLUS
1000.0000 mL | Freq: Once | INTRAVENOUS | Status: AC
  Administered 2019-03-16: 1000 mL via INTRAVENOUS

## 2019-03-16 MED ORDER — ALBUTEROL SULFATE HFA 108 (90 BASE) MCG/ACT IN AERS
2.0000 | INHALATION_SPRAY | RESPIRATORY_TRACT | Status: DC | PRN
  Administered 2019-03-16: 2 via RESPIRATORY_TRACT
  Filled 2019-03-16: qty 6.7

## 2019-03-16 MED ORDER — PREDNISONE 20 MG PO TABS
40.0000 mg | ORAL_TABLET | Freq: Once | ORAL | Status: AC
  Administered 2019-03-16: 15:00:00 40 mg via ORAL
  Filled 2019-03-16: qty 2

## 2019-03-16 NOTE — ED Provider Notes (Signed)
[facility] EMERGENCY DEPARTMENT Provider Note   CSN: [id number] Arrival date & time: 03/16/19  1227     History   Chief Complaint Chief Complaint  Patient presents with  . tachy/ CP    HPI Terri Ross is a 28 y.o. female.     HPI Patient presents with chest tightness fast heart rate and shortness of breath.  Has been going for last couple days.  States her inhaler helps briefly but then it comes back.  She thinks it could be asthma or allergies since her roommate had mowed her lawn.  Also states she is very anxious.  States it was a long hospital has given her PTSD in the past.  History of anxiety.  Has been taking allergy medicines.  No swelling in her legs.  States she is very anxious.  Chest pain is dull.  States she is short of breath. Past Medical History:  Diagnosis Date  . Anxiety   . Arthritis   . Asthma   . Rheumatoid arthritis [facility])     Patient Active Problem List   Diagnosis Date Noted  . Rheumatoid arthritis ([place]) 07/28/2016  . Chronic tension-type headache, not intractable 07/26/2016  . Depression 02/04/2016  . GAD (generalized anxiety disorder) 02/04/2016  . Mild intermittent asthma 07/16/2013    Past Surgical History:  Procedure Laterality Date  . NO PAST SURGERIES       OB History   No obstetric history on file.      Home Medications    Prior to Admission medications   Medication Sig Start Date End Date Taking? Authorizing Provider  albuterol (PROVENTIL HFA;VENTOLIN HFA) 108 (90 Base) MCG/ACT inhaler INHALE 2 PUFFS EVERY 6 HOURS AS NEEDED FOR WHEEZING OR SHORTNESS OF BREATH 05/15/18  Yes McVey, Gelene Mink, PA-C  ALPRAZolam (XANAX) 0.25 MG tablet Take 1-2 tablets (0.25-0.5 mg total) by mouth 2 (two) times daily as needed (panic attack). 05/02/18  Yes McVey, Gelene Mink, PA-C  hydrOXYzine (ATARAX/VISTARIL) 50 MG tablet TAKE 1 TO 2 TABLETS BY MOUTH EVERY 8 HOURS AS NEEDED FOR ANXIETY Patient taking differently: Take  50 mg by mouth every 8 (eight) hours as needed for anxiety or itching.  05/02/18  Yes McVey, Gelene Mink, PA-C  imipramine (TOFRANIL) 25 MG tablet Take 25 mg by mouth daily. 02/25/19  Yes [provider]  loratadine (CLARITIN) 10 MG tablet Take 10 mg by mouth daily as needed for allergies.   Yes [provider]  [place] 0.1-20 MG-MCG tablet Take 1 tablet by mouth daily. 03/01/19  Yes [provider]  budesonide-formoterol (SYMBICORT) 80-4.5 MCG/ACT inhaler Inhale 2 puffs into the lungs 2 (two) times daily. Patient not taking: Reported on 03/16/2019 11/14/17   McVey, Gelene Mink, PA-C  clonazePAM (KLONOPIN) 0.25 MG disintegrating tablet Take 1 tablet (0.25 mg total) by mouth 2 (two) times daily. Patient not taking: Reported on 03/16/2019 04/10/18   McVey, Gelene Mink, PA-C  gabapentin (NEURONTIN) 300 MG capsule Take 1 capsule (300 mg total) by mouth 3 (three) times daily. Patient not taking: Reported on 03/16/2019 06/14/18   Rutherford Guys, MD  montelukast (SINGULAIR) 10 MG tablet Take 1 tablet (10 mg total) by mouth at bedtime. Patient not taking: Reported on 03/16/2019 10/16/17   McVey, Gelene Mink, PA-C  predniSONE (DELTASONE) 20 MG tablet Take 2 tablets (40 mg total) by mouth daily. 03/17/19   Davonna Belling, MD    Family History Family History  Problem Relation Age of Onset  . Hypertension  Father   . Anxiety disorder Father   . Asthma Sister   . Arthritis Sister   . Schizophrenia Sister   . Cancer Maternal Grandmother   . Cancer Paternal Grandmother        lung, POSITIVE TOBACCO    Social History Social History   Tobacco Use  . Smoking status: Never Smoker  . Smokeless tobacco: Never Used  Substance Use Topics  . Alcohol use: Yes    Comment: occ  . Drug use: No     Allergies   Voltaren [diclofenac sodium], Doxycycline, and Lamotrigine   Review of Systems Review of Systems  Constitutional: Negative for appetite change,  fatigue and fever.  HENT: Positive for congestion.   Respiratory: Positive for shortness of breath.   Cardiovascular: Positive for chest pain. Negative for leg swelling.  Gastrointestinal: Negative for abdominal pain.  Genitourinary: Negative for flank pain.  Musculoskeletal: Negative for back pain.  Skin: Negative for rash.  Neurological: Negative for weakness.  Psychiatric/Behavioral: The patient is nervous/anxious.      Physical Exam Updated Vital Signs BP 127/90   Pulse (!) 118   Temp 98.4 F (36.9 C) (Oral)   Resp 17   SpO2 99%   Physical Exam Vitals signs and nursing note reviewed.  HENT:     Head: Normocephalic.  Eyes:     Extraocular Movements: Extraocular movements intact.  Neck:     Musculoskeletal: Neck supple.  Cardiovascular:     Rate and Rhythm: Tachycardia present.  Pulmonary:     Breath sounds: Wheezing present.     Comments: Mild wheezes and prolonged expirations bilaterally. Abdominal:     Palpations: Abdomen is soft.  Musculoskeletal:     Right lower leg: No edema.     Left lower leg: No edema.  Skin:    General: Skin is warm.     Capillary Refill: Capillary refill takes less than 2 seconds.  Neurological:     General: No focal deficit present.     Mental Status: She is alert.      ED Treatments / Results  Labs (all labs ordered are listed, but only abnormal results are displayed) Labs Reviewed  BASIC METABOLIC PANEL - Abnormal; Notable for the following components:      Result Value   CO2 20 (*)    Creatinine, Ser 1.05 (*)    All other components within normal limits  CBC - Abnormal; Notable for the following components:   WBC 11.2 (*)    RBC 5.40 (*)    Hemoglobin 15.3 (*)    HCT 46.6 (*)    RDW 11.3 (*)    All other components within normal limits  D-DIMER, QUANTITATIVE (NOT AT [facility])  I-STAT BETA HCG BLOOD, ED ([facility], [facility], [facility] ONLY)    EKG EKG Interpretation  Date/Time:  Sunday March 16 2019 12:46:54 EST Ventricular Rate:   146 PR Interval:  120 QRS Duration: 76 QT Interval:  278 QTC Calculation: 433 R Axis:   67 Text Interpretation: Sinus tachycardia Otherwise normal ECG No old tracing to compare Confirmed by Benjiman Core [phone]) on 03/16/2019 1:13:25 PM   Radiology Dg Chest Portable 1 View  Result Date: 03/16/2019 CLINICAL DATA:  Shortness of breath EXAM: PORTABLE CHEST 1 VIEW COMPARISON:  01/21/2010 FINDINGS: The heart size and mediastinal contours are within normal limits. Both lungs are clear. The visualized skeletal structures are unremarkable. IMPRESSION: No acute abnormality of the lungs in AP portable projection. Electronically Signed   By: Trinna Post  Jayme Cloud M.D.   On: 03/16/2019 14:11    Procedures Procedures (including critical care time)  Medications Ordered in ED Medications  albuterol (VENTOLIN HFA) 108 (90 Base) MCG/ACT inhaler 2 puff (2 puffs Inhalation Given 03/16/19 1443)  sodium chloride 0.9 % bolus 1,000 mL (1,000 mLs Intravenous New Bag/Given 03/16/19 1444)  predniSONE (DELTASONE) tablet 40 mg (40 mg Oral Given 03/16/19 1443)     Initial Impression / Assessment and Plan / ED Course  I have reviewed the triage vital signs and the nursing notes.  Pertinent labs & imaging results that were available during my care of the patient were reviewed by me and considered in my medical decision making (see chart for details).        Patient presented with shortness of breath.  Tachycardia.  Chest tightness.  Has had some mild wheezes.  Will give steroids.  Negative D-dimer.  Doubt Covid infection.  She has had 2 rather recent negative tests.  Heart rate came down after fluid and some time.  Discharge home  Imunique Tacheny was evaluated in Emergency Department on 03/16/2019 for the symptoms described in the history of present illness. She was evaluated in the context of the global COVID-19 pandemic, which necessitated consideration that the patient might be at risk for infection with the SARS-CoV-2  virus that causes COVID-19. Institutional protocols and algorithms that pertain to the evaluation of patients at risk for COVID-19 are in a state of rapid change based on information released by regulatory bodies including the CDC and federal and state organizations. These policies and algorithms were followed during the patient's care in the ED.  Final Clinical Impressions(s) / ED Diagnoses   Final diagnoses:  Shortness of breath  Anxiety    ED Discharge Orders         Ordered    predniSONE (DELTASONE) 20 MG tablet  Daily     03/16/19 1755           Benjiman Core, MD 03/16/19 1758

## 2019-03-16 NOTE — ED Notes (Signed)
Patient verbalizes understanding of discharge instructions. Opportunity for questioning and answers were provided. Armband removed by staff, pt discharged from ED ambulatory to home.  

## 2019-03-16 NOTE — ED Triage Notes (Signed)
Patient complains of chest tightness and tachycardia for 1-2 days and seen at urgent care for same. Patient appears anxious and not sure if it is her asthma or allergies. States that her anxiety has kicked in and made her more anxious

## 2019-03-18 DIAGNOSIS — M069 Rheumatoid arthritis, unspecified: Secondary | ICD-10-CM | POA: Diagnosis not present

## 2019-03-18 DIAGNOSIS — F411 Generalized anxiety disorder: Secondary | ICD-10-CM | POA: Diagnosis not present

## 2019-03-18 DIAGNOSIS — R000 Tachycardia, unspecified: Secondary | ICD-10-CM | POA: Diagnosis not present

## 2019-03-18 DIAGNOSIS — F321 Major depressive disorder, single episode, moderate: Secondary | ICD-10-CM | POA: Diagnosis not present

## 2019-03-19 DIAGNOSIS — F411 Generalized anxiety disorder: Secondary | ICD-10-CM | POA: Diagnosis not present

## 2019-03-26 DIAGNOSIS — F411 Generalized anxiety disorder: Secondary | ICD-10-CM | POA: Diagnosis not present

## 2019-03-30 DIAGNOSIS — R21 Rash and other nonspecific skin eruption: Secondary | ICD-10-CM | POA: Diagnosis not present

## 2019-03-31 ENCOUNTER — Other Ambulatory Visit: Payer: Self-pay | Admitting: Cardiology

## 2019-03-31 DIAGNOSIS — Z20822 Contact with and (suspected) exposure to covid-19: Secondary | ICD-10-CM

## 2019-04-01 DIAGNOSIS — R42 Dizziness and giddiness: Secondary | ICD-10-CM | POA: Diagnosis not present

## 2019-04-01 DIAGNOSIS — R000 Tachycardia, unspecified: Secondary | ICD-10-CM | POA: Diagnosis not present

## 2019-04-01 DIAGNOSIS — F411 Generalized anxiety disorder: Secondary | ICD-10-CM | POA: Diagnosis not present

## 2019-04-01 DIAGNOSIS — F410 Panic disorder [episodic paroxysmal anxiety] without agoraphobia: Secondary | ICD-10-CM | POA: Diagnosis not present

## 2019-04-03 LAB — NOVEL CORONAVIRUS, NAA: SARS-CoV-2, NAA: NOT DETECTED

## 2019-04-09 ENCOUNTER — Telehealth: Payer: Self-pay

## 2019-04-09 DIAGNOSIS — F411 Generalized anxiety disorder: Secondary | ICD-10-CM | POA: Diagnosis not present

## 2019-04-09 NOTE — Telephone Encounter (Signed)
NOTES ON FILE FROM [facility] [phone], SENT REFERRAL TO SCHEDULING 

## 2019-04-16 DIAGNOSIS — F411 Generalized anxiety disorder: Secondary | ICD-10-CM | POA: Diagnosis not present

## 2019-04-23 DIAGNOSIS — F411 Generalized anxiety disorder: Secondary | ICD-10-CM | POA: Diagnosis not present

## 2019-04-29 DIAGNOSIS — F4310 Post-traumatic stress disorder, unspecified: Secondary | ICD-10-CM | POA: Diagnosis not present

## 2019-04-29 DIAGNOSIS — J4520 Mild intermittent asthma, uncomplicated: Secondary | ICD-10-CM | POA: Diagnosis not present

## 2019-04-29 DIAGNOSIS — F411 Generalized anxiety disorder: Secondary | ICD-10-CM | POA: Diagnosis not present

## 2019-04-29 DIAGNOSIS — F410 Panic disorder [episodic paroxysmal anxiety] without agoraphobia: Secondary | ICD-10-CM | POA: Diagnosis not present

## 2019-05-05 DIAGNOSIS — R42 Dizziness and giddiness: Secondary | ICD-10-CM | POA: Insufficient documentation

## 2019-05-06 ENCOUNTER — Other Ambulatory Visit: Payer: Self-pay

## 2019-05-06 ENCOUNTER — Ambulatory Visit: Payer: BC Managed Care – PPO | Admitting: Internal Medicine

## 2019-05-06 ENCOUNTER — Encounter: Payer: Self-pay | Admitting: Internal Medicine

## 2019-05-06 DIAGNOSIS — R42 Dizziness and giddiness: Secondary | ICD-10-CM

## 2019-05-06 NOTE — Progress Notes (Signed)
ELECTROPHYSIOLOGY CONSULT NOTE  Patient ID: Terri Ross, MRN: [id number], DOB/AGE: June 17, 1990 28 y.o. Admit date: (Not on file) Date of Consult: 05/06/2019  Primary Physician: Morrell Riddle, PA-C Primary Cardiologist:new    Terri Ross is a 28 y.o. female who is being seen today for the evaluation of tachycardia at the request of SW.  Prefers to be called with last name not first name HPI Terri Ross is a 28 y.o. female with a longstanding history of anxiety depression to which she refers without details, PTSD who also has a longstanding history of exercise associated tachypalpitations and orthostatic intolerance.  Orthostatic intolerance is common.  She has had syncope x1 when she was bent over and stood up.  Shower intolerance.  No history of menses intolerance.  Has never been aerobically fit but did have lightheadedness when she was weightlifting.  Diet is deplete of fluid and modestly replete sodium.  Menses suppression with oral contraceptive therapy.  More recently was tried on imipramine for depression.  Long list of intolerant of medications.  Is seen a therapist and has referral for psychiatry  11/20 was seen in the emergency room for chest pain and "heart was eating its way out of my chest ," a typical euphamistic expression for her.  At night she had awakened from sleep with her heart pounding.  Using a pulse oximeter noted her heart rate was 115.  Future inhaler and went back to sleep.  Awakened sometime later.  Heart rate was about 130.  She stood up heart rate was 170.  Went to the ER.  ECGs were reviewed.  Heart rate 146 with P wave morphologies consistent  Seen at primary care office.  Orthostatic vital signs demonstrated increase in heart rate from 83--130 with a blood pressure blood pressure change from 130--114  Hx of asthma and takes for dyspnea   Past Medical History:  Diagnosis Date  . Anxiety   . Arthritis   . Asthma   . Dizzy   . Panic attacks     . Rheumatoid arthritis ([facility])   . Tachycardia   . Urticaria       Surgical History:  Past Surgical History:  Procedure Laterality Date  . NO PAST SURGERIES       Home Meds: Current Meds  Medication Sig  . albuterol (PROVENTIL HFA;VENTOLIN HFA) 108 (90 Base) MCG/ACT inhaler INHALE 2 PUFFS EVERY 6 HOURS AS NEEDED FOR WHEEZING OR SHORTNESS OF BREATH  . ALPRAZolam (XANAX) 0.25 MG tablet Take 1-2 tablets (0.25-0.5 mg total) by mouth 2 (two) times daily as needed (panic attack).  . hydrOXYzine (ATARAX/VISTARIL) 50 MG tablet TAKE 1 TO 2 TABLETS BY MOUTH EVERY 8 HOURS AS NEEDED FOR ANXIETY  . imipramine (TOFRANIL) 25 MG tablet Take 25 mg by mouth daily.  Marland Kitchen loratadine (CLARITIN) 10 MG tablet Take 10 mg by mouth daily as needed for allergies.  . metaxalone (SKELAXIN) 800 MG tablet Take 800 mg by mouth 3 (three) times daily.  . predniSONE (DELTASONE) 20 MG tablet Take 2 tablets (40 mg total) by mouth daily.  . SRONYX 0.1-20 MG-MCG tablet Take 1 tablet by mouth daily.    Allergies:  Allergies  Allergen Reactions  . Voltaren [Diclofenac Sodium] Shortness Of Breath  . Doxycycline   . Lamotrigine Rash    Social History   Socioeconomic History  . Marital status: Single    Spouse name: Not on file  . Number of children: 0  . Years of education:  high school  . Highest education level: Not on file  Occupational History  . Occupation: security    Comment: [employer]  Tobacco Use  . Smoking status: Never Smoker  . Smokeless tobacco: Never Used  Substance and Sexual Activity  . Alcohol use: Yes    Comment: occ  . Drug use: No  . Sexual activity: Yes  Other Topics Concern  . Not on file  Social History Narrative   Originally from [place].   Was raised by her paternal grandparents after her biological mother abandoned her and her twin sister as infants with their father, who was not able to raise them alone. They adopted the girls.   She ran away at age 47.    Talks to her father daily  by phone.   Lives with 2 roommates.   Social Determinants of Health   Financial Resource Strain:   . Difficulty of Paying Living Expenses: Not on file  Food Insecurity:   . Worried About Programme researcher, broadcasting/film/video in the Last Year: Not on file  . Ran Out of Food in the Last Year: Not on file  Transportation Needs:   . Lack of Transportation (Medical): Not on file  . Lack of Transportation (Non-Medical): Not on file  Physical Activity:   . Days of Exercise per Week: Not on file  . Minutes of Exercise per Session: Not on file  Stress:   . Feeling of Stress : Not on file  Social Connections:   . Frequency of Communication with Friends and Family: Not on file  . Frequency of Social Gatherings with Friends and Family: Not on file  . Attends Religious Services: Not on file  . Active Member of Clubs or Organizations: Not on file  . Attends Banker Meetings: Not on file  . Marital Status: Not on file  Intimate Partner Violence:   . Fear of Current or Ex-Partner: Not on file  . Emotionally Abused: Not on file  . Physically Abused: Not on file  . Sexually Abused: Not on file     Family History  Problem Relation Age of Onset  . Hypertension Father   . Anxiety disorder Father   . Asthma Sister   . Arthritis Sister   . Schizophrenia Sister   . Cancer Maternal Grandmother   . Cancer Paternal Grandmother        lung, POSITIVE TOBACCO     ROS:  Please see the history of present illness.     All other systems reviewed and negative.    Physical Exam: Blood pressure 120/90, pulse (!) 112, height 5\' 2"  (1.575 m), weight 197 lb 6.4 oz (89.5 kg), SpO2 99 %. General: Well developed, well nourished female in no acute distress. Head: Normocephalic, atraumatic, sclera non-icteric, no xanthomas, nares are without discharge. EENT: normal  Lymph Nodes:  none Neck: Negative for carotid bruits. JVD not elevated. Back:without scoliosis kyphosis Lungs: Clear bilaterally to auscultation  without wheezes, rales, or rhonchi. Breathing is unlabored. Heart: RRR with S1 S2. No  murmur . No rubs, or gallops appreciated. Abdomen: Soft, non-tender, non-distended with normoactive bowel sounds. No hepatomegaly. No rebound/guarding. No obvious abdominal masses. Msk:  Strength and tone appear normal for age. Extremities: No clubbing or cyanosis. No edema.  Distal pedal pulses are 2+ and equal bilaterally. Skin: Warm and Dry Neuro: Alert and oriented X 3. CN III-XII intact Grossly normal sensory and motor function . Psych:  Responds to questions appropriately with a normal affect.  Labs: Cardiac Enzymes No results for input(s): CKTOTAL, CKMB, TROPONINI in the last 72 hours. CBC Lab Results  Component Value Date   WBC 11.2 (H) 03/16/2019   HGB 15.3 (H) 03/16/2019   HCT 46.6 (H) 03/16/2019   MCV 86.3 03/16/2019   PLT 349 03/16/2019   PROTIME: No results for input(s): LABPROT, INR in the last 72 hours. Chemistry No results for input(s): NA, K, CL, CO2, BUN, CREATININE, CALCIUM, PROT, BILITOT, ALKPHOS, ALT, AST, GLUCOSE in the last 168 hours.  Invalid input(s): LABALBU Lipids No results found for: CHOL, HDL, LDLCALC, TRIG BNP No results found for: PROBNP Thyroid Function Tests: No results for input(s): TSH, T4TOTAL, T3FREE, THYROIDAB in the last 72 hours.  Invalid input(s): FREET3 Miscellaneous Lab Results  Component Value Date   DDIMER <0.27 03/16/2019    Radiology/Studies:  No results found.  EKG: Sinus at 112 Intervals 13/08/34   Assessment and Plan:  Orthostatic intolerance  Sinus tachycardia  Anxiety/depression  We discussed extensively the issues of dysautonomia, the physiology of orthstasis and positional stress.  We discussed the role of salt and water repletion, the importance of exercise, often needing to be started in the recumbent position, and the awareness of triggers and the role of ambient heat and dehydration  With her blood pressure  being somewhat elevated and reluctant to push sodium too much.  I will try low-dose beta-blockers. Atenolol, metoprolol and bisoprolol--  Discussed role of exercise, compressive wear and ongoing psych attention     Virl Axe

## 2019-05-06 NOTE — Patient Instructions (Signed)
Medication Instructions:   You are being given three prescriptions for different beta blockers to try.  You may take these in any order. DO NOT TAKE MORE THAN ONE BETA BLOCKER AT A TIME. Choose one beta blocker to start with. If after two weeks, you feel the medication is working well for you, continue that current medication. If it does not work well for you, try another prescription for a beta blocker at that time. You may continue that current beta blocker at that time if it works well for you. If it does not, repeat these instructions for the third beta blocker.  When you find a beta blocker that works well for you, call the office and we will send in a prescription for you.               1. Atenolol 25mg  tablet             2. Metoprolol Succ 25mg  tablet             3. Bisoprolol 2.5mg  tablet  You may take these in any order you please.   Please call us with any questions.  If you need a refill on your cardiac medications before your next appointment, please call your pharmacy*  Lab Work: None ordered.  If you have labs (blood work) drawn today and your tests are completely normal, you will receive your results only by: Marland Kitchen MyChart Message (if you have MyChart) OR . A paper copy in the mail If you have any lab test that is abnormal or we need to change your treatment, we will call you to review the results.  Testing/Procedures: None ordered.   Follow-Up: At [facility], you and your health needs are our priority.  As part of our continuing mission to provide you with exceptional heart care, we have created designated Provider Care Teams.  These Care Teams include your primary Cardiologist (physician) and Advanced Practice Providers (APPs -  Physician Assistants and Nurse Practitioners) who all work together to provide you with the care you need, when you need it.  Your next appointment:    Dr Caryl Comes would like you to try compression wear, make sure you are drinking  plenty of fluids to stay well hydrated.

## 2019-05-07 DIAGNOSIS — F411 Generalized anxiety disorder: Secondary | ICD-10-CM | POA: Diagnosis not present

## 2019-05-14 DIAGNOSIS — F411 Generalized anxiety disorder: Secondary | ICD-10-CM | POA: Diagnosis not present

## 2019-05-20 DIAGNOSIS — F411 Generalized anxiety disorder: Secondary | ICD-10-CM | POA: Diagnosis not present

## 2019-05-20 DIAGNOSIS — R000 Tachycardia, unspecified: Secondary | ICD-10-CM | POA: Diagnosis not present

## 2019-05-20 DIAGNOSIS — F4310 Post-traumatic stress disorder, unspecified: Secondary | ICD-10-CM | POA: Diagnosis not present

## 2019-05-21 ENCOUNTER — Telehealth: Payer: Self-pay | Admitting: Internal Medicine

## 2019-05-21 DIAGNOSIS — F411 Generalized anxiety disorder: Secondary | ICD-10-CM | POA: Diagnosis not present

## 2019-05-21 NOTE — Telephone Encounter (Signed)
Pt's medication was clarified per D. Klein's LOV 05/06/19, he wanted pt to try metoprolol 25 mg tablet daily to see this medication works for the pt, but he did not add to pt's medication list. Pharmacy tech verbalized understanding.

## 2019-05-21 NOTE — Telephone Encounter (Signed)
[place] from [facility] is calling in regards to medication prescription order. She states the amount of medication to be dispensed is not included in prescription order. Please call.    *STAT* If patient is at the pharmacy, call can be transferred to refill team.   1. Which medications need to be refilled? (please list name of each medication and dose if known) Metoprolol Succ 25mg  tablet  2. Which pharmacy/location (including street and city if local pharmacy) is medication to be sent to? [id number] [place], [place] - [address]  3. Do they need a 30 day or 90 day supply?  [facility]  [facility] [phone]

## 2019-05-28 DIAGNOSIS — F411 Generalized anxiety disorder: Secondary | ICD-10-CM | POA: Diagnosis not present

## 2019-06-03 ENCOUNTER — Telehealth: Payer: Self-pay | Admitting: *Deleted

## 2019-06-03 DIAGNOSIS — R000 Tachycardia, unspecified: Secondary | ICD-10-CM | POA: Diagnosis not present

## 2019-06-03 DIAGNOSIS — F411 Generalized anxiety disorder: Secondary | ICD-10-CM | POA: Diagnosis not present

## 2019-06-03 DIAGNOSIS — J4520 Mild intermittent asthma, uncomplicated: Secondary | ICD-10-CM | POA: Diagnosis not present

## 2019-06-03 DIAGNOSIS — F4310 Post-traumatic stress disorder, unspecified: Secondary | ICD-10-CM | POA: Diagnosis not present

## 2019-06-03 NOTE — Telephone Encounter (Signed)
Call transferred into triage--spoke with pharmacist at [facility] [phone].   He has a paper prescription for this patient for bisoprolol 2.5 mg -one PO QD.  He states there are some missing elements. I did not see this medication on patient's current med list. Will forward to Dr. Graciela Husbands and his covering nurse to call back and ask for pharmacist to complete/verify prescription.

## 2019-06-03 NOTE — Telephone Encounter (Signed)
Spoke with Byrd Hesselbach at [facility] re: Bisoprolol 2.5mg  Rx.  Advised Rx was written on 05/06/2019 for #30 tablets and 0 refills.

## 2019-06-04 ENCOUNTER — Other Ambulatory Visit: Payer: Self-pay | Admitting: Internal Medicine

## 2019-06-04 DIAGNOSIS — F411 Generalized anxiety disorder: Secondary | ICD-10-CM | POA: Diagnosis not present

## 2019-06-04 NOTE — Telephone Encounter (Signed)
Pharmacy stating that they cannot get bisoprolol 2.5 mg tablet and would like to know if Dr. Graciela Husbands would prescribe bisoprolol 5 mg tablet pt taking 1/2 tablet daily so pt can get her medication. Please address

## 2019-06-04 NOTE — Telephone Encounter (Signed)
Sounds great!

## 2019-06-05 MED ORDER — BISOPROLOL FUMARATE 5 MG PO TABS: 2.5000 mg | ORAL_TABLET | Freq: Every day | ORAL | 1 refills | Status: DC

## 2019-06-05 NOTE — Addendum Note (Signed)
Addended by: Demetrios Loll on: 06/05/2019 08:07 AM   Modules accepted: Orders

## 2019-06-05 NOTE — Telephone Encounter (Signed)
Pt's medication Bisoprolol 5 mg tablet, with pt taking 1/2 daily, was sent to pt's pharmacy, per Dr. Graciela Husbands, confirmation received.

## 2019-06-11 DIAGNOSIS — F411 Generalized anxiety disorder: Secondary | ICD-10-CM | POA: Diagnosis not present

## 2019-06-17 DIAGNOSIS — M79671 Pain in right foot: Secondary | ICD-10-CM | POA: Diagnosis not present

## 2019-06-17 DIAGNOSIS — M79602 Pain in left arm: Secondary | ICD-10-CM | POA: Diagnosis not present

## 2019-06-17 DIAGNOSIS — F4310 Post-traumatic stress disorder, unspecified: Secondary | ICD-10-CM | POA: Diagnosis not present

## 2019-06-17 DIAGNOSIS — M79672 Pain in left foot: Secondary | ICD-10-CM | POA: Diagnosis not present

## 2019-06-17 DIAGNOSIS — M79601 Pain in right arm: Secondary | ICD-10-CM | POA: Diagnosis not present

## 2019-06-17 DIAGNOSIS — F411 Generalized anxiety disorder: Secondary | ICD-10-CM | POA: Diagnosis not present

## 2019-06-17 DIAGNOSIS — F410 Panic disorder [episodic paroxysmal anxiety] without agoraphobia: Secondary | ICD-10-CM | POA: Diagnosis not present

## 2019-06-17 DIAGNOSIS — M542 Cervicalgia: Secondary | ICD-10-CM | POA: Diagnosis not present

## 2019-06-18 DIAGNOSIS — F411 Generalized anxiety disorder: Secondary | ICD-10-CM | POA: Diagnosis not present

## 2019-06-24 DIAGNOSIS — R42 Dizziness and giddiness: Secondary | ICD-10-CM | POA: Diagnosis not present

## 2019-06-24 DIAGNOSIS — F411 Generalized anxiety disorder: Secondary | ICD-10-CM | POA: Diagnosis not present

## 2019-06-24 DIAGNOSIS — M79672 Pain in left foot: Secondary | ICD-10-CM | POA: Diagnosis not present

## 2019-06-24 DIAGNOSIS — M79671 Pain in right foot: Secondary | ICD-10-CM | POA: Diagnosis not present

## 2019-06-25 DIAGNOSIS — F411 Generalized anxiety disorder: Secondary | ICD-10-CM | POA: Diagnosis not present

## 2019-06-27 DIAGNOSIS — R42 Dizziness and giddiness: Secondary | ICD-10-CM | POA: Diagnosis not present

## 2019-06-27 DIAGNOSIS — R2689 Other abnormalities of gait and mobility: Secondary | ICD-10-CM | POA: Diagnosis not present

## 2019-06-27 DIAGNOSIS — Z9181 History of falling: Secondary | ICD-10-CM | POA: Diagnosis not present

## 2019-07-02 DIAGNOSIS — R2689 Other abnormalities of gait and mobility: Secondary | ICD-10-CM | POA: Diagnosis not present

## 2019-07-02 DIAGNOSIS — Z9181 History of falling: Secondary | ICD-10-CM | POA: Diagnosis not present

## 2019-07-02 DIAGNOSIS — R42 Dizziness and giddiness: Secondary | ICD-10-CM | POA: Diagnosis not present

## 2019-07-02 DIAGNOSIS — F411 Generalized anxiety disorder: Secondary | ICD-10-CM | POA: Diagnosis not present

## 2019-07-09 DIAGNOSIS — F411 Generalized anxiety disorder: Secondary | ICD-10-CM | POA: Diagnosis not present

## 2019-07-10 ENCOUNTER — Telehealth: Payer: BC Managed Care – PPO | Admitting: Internal Medicine

## 2019-07-11 DIAGNOSIS — R42 Dizziness and giddiness: Secondary | ICD-10-CM | POA: Diagnosis not present

## 2019-07-11 DIAGNOSIS — Z9181 History of falling: Secondary | ICD-10-CM | POA: Diagnosis not present

## 2019-07-11 DIAGNOSIS — R2689 Other abnormalities of gait and mobility: Secondary | ICD-10-CM | POA: Diagnosis not present

## 2019-07-11 DIAGNOSIS — F411 Generalized anxiety disorder: Secondary | ICD-10-CM | POA: Diagnosis not present

## 2019-07-22 DIAGNOSIS — N926 Irregular menstruation, unspecified: Secondary | ICD-10-CM | POA: Diagnosis not present

## 2019-07-22 DIAGNOSIS — R569 Unspecified convulsions: Secondary | ICD-10-CM | POA: Diagnosis not present

## 2019-07-22 DIAGNOSIS — R42 Dizziness and giddiness: Secondary | ICD-10-CM | POA: Diagnosis not present

## 2019-07-23 DIAGNOSIS — F411 Generalized anxiety disorder: Secondary | ICD-10-CM | POA: Diagnosis not present

## 2019-07-24 DIAGNOSIS — R42 Dizziness and giddiness: Secondary | ICD-10-CM | POA: Diagnosis not present

## 2019-07-24 DIAGNOSIS — N926 Irregular menstruation, unspecified: Secondary | ICD-10-CM | POA: Diagnosis not present

## 2019-07-24 DIAGNOSIS — M2550 Pain in unspecified joint: Secondary | ICD-10-CM | POA: Diagnosis not present

## 2019-07-25 ENCOUNTER — Ambulatory Visit: Payer: BC Managed Care – PPO

## 2019-07-25 DIAGNOSIS — R42 Dizziness and giddiness: Secondary | ICD-10-CM | POA: Diagnosis not present

## 2019-07-25 DIAGNOSIS — Z9181 History of falling: Secondary | ICD-10-CM | POA: Diagnosis not present

## 2019-07-25 DIAGNOSIS — R2689 Other abnormalities of gait and mobility: Secondary | ICD-10-CM | POA: Diagnosis not present

## 2019-07-30 DIAGNOSIS — F411 Generalized anxiety disorder: Secondary | ICD-10-CM | POA: Diagnosis not present

## 2019-08-02 ENCOUNTER — Ambulatory Visit: Payer: BC Managed Care – PPO | Attending: Internal Medicine

## 2019-08-02 DIAGNOSIS — Z23 Encounter for immunization: Secondary | ICD-10-CM

## 2019-08-02 NOTE — Progress Notes (Signed)
   Covid-19 Vaccination Clinic  Name:  Terri Ross    MRN: [id number] DOB: 10-21-1990  08/02/2019  [name] was observed post Covid-19 immunization for 30 minutes based on pre-vaccination screening without incident. She was provided with Vaccine Information Sheet and instruction to access the V-Safe system.   [name] was instructed to call 911 with any severe reactions post vaccine: Marland Kitchen Difficulty breathing  . Swelling of face and throat  . A fast heartbeat  . A bad rash all over body  . Dizziness and weakness   Immunizations Administered    Name Date Dose VIS Date Route   Pfizer COVID-19 Vaccine 08/02/2019  4:20 PM 0.3 mL 04/18/2019 Intramuscular   Manufacturer: [employer], [employer]   Lot: [id number]   NDC: [address]-9

## 2019-08-06 DIAGNOSIS — F411 Generalized anxiety disorder: Secondary | ICD-10-CM | POA: Diagnosis not present

## 2019-08-12 DIAGNOSIS — J4520 Mild intermittent asthma, uncomplicated: Secondary | ICD-10-CM | POA: Diagnosis not present

## 2019-08-12 DIAGNOSIS — F4310 Post-traumatic stress disorder, unspecified: Secondary | ICD-10-CM | POA: Diagnosis not present

## 2019-08-12 DIAGNOSIS — F411 Generalized anxiety disorder: Secondary | ICD-10-CM | POA: Diagnosis not present

## 2019-08-12 DIAGNOSIS — E559 Vitamin D deficiency, unspecified: Secondary | ICD-10-CM | POA: Diagnosis not present

## 2019-08-13 DIAGNOSIS — F411 Generalized anxiety disorder: Secondary | ICD-10-CM | POA: Diagnosis not present

## 2019-08-15 DIAGNOSIS — F411 Generalized anxiety disorder: Secondary | ICD-10-CM | POA: Diagnosis not present

## 2019-08-15 DIAGNOSIS — Z87898 Personal history of other specified conditions: Secondary | ICD-10-CM | POA: Diagnosis not present

## 2019-08-15 DIAGNOSIS — R2689 Other abnormalities of gait and mobility: Secondary | ICD-10-CM | POA: Diagnosis not present

## 2019-08-15 DIAGNOSIS — R42 Dizziness and giddiness: Secondary | ICD-10-CM | POA: Diagnosis not present

## 2019-08-15 DIAGNOSIS — Z9181 History of falling: Secondary | ICD-10-CM | POA: Diagnosis not present

## 2019-08-15 DIAGNOSIS — G253 Myoclonus: Secondary | ICD-10-CM | POA: Diagnosis not present

## 2019-08-15 DIAGNOSIS — R253 Fasciculation: Secondary | ICD-10-CM | POA: Diagnosis not present

## 2019-08-15 DIAGNOSIS — E559 Vitamin D deficiency, unspecified: Secondary | ICD-10-CM | POA: Diagnosis not present

## 2019-08-16 DIAGNOSIS — I471 Supraventricular tachycardia: Secondary | ICD-10-CM | POA: Insufficient documentation

## 2019-08-18 ENCOUNTER — Other Ambulatory Visit: Payer: Self-pay

## 2019-08-18 ENCOUNTER — Encounter: Payer: Self-pay | Admitting: Internal Medicine

## 2019-08-18 ENCOUNTER — Ambulatory Visit: Payer: BC Managed Care – PPO | Admitting: Internal Medicine

## 2019-08-18 DIAGNOSIS — I471 Supraventricular tachycardia: Secondary | ICD-10-CM

## 2019-08-18 NOTE — Patient Instructions (Signed)
Medication Instructions:   Your physician recommends that you continue on your current medications as directed. Please refer to the Current Medication list given to you today.  *If you need a refill on your cardiac medications before your next appointment, please call your pharmacy*   Lab Work: None ordered.  If you have labs (blood work) drawn today and your tests are completely normal, you will receive your results only by: Marland Kitchen MyChart Message (if you have MyChart) OR . A paper copy in the mail If you have any lab test that is abnormal or we need to change your treatment, we will call you to review the results.   Testing/Procedures: None ordered.    Follow-Up: At [facility], you and your health needs are our priority.  As part of our continuing mission to provide you with exceptional heart care, we have created designated Provider Care Teams.  These Care Teams include your primary Cardiologist (physician) and Advanced Practice Providers (APPs -  Physician Assistants and Nurse Practitioners) who all work together to provide you with the care you need, when you need it.  We recommend signing up for the patient portal called "MyChart".  Sign up information is provided on this After Visit Summary.  MyChart is used to connect with patients for Virtual Visits (Telemedicine).  Patients are able to view lab/test results, encounter notes, upcoming appointments, etc.  Non-urgent messages can be sent to your provider as well.   To learn more about what you can do with MyChart, go to [email or web address].    Your next appointment:   4 month(s)  The format for your next appointment:   Virtual Visit   Provider:   You may see Dr Graciela Husbands or one of the following Advanced Practice Providers on your designated Care Team:    Gypsy Balsam, NP  Francis Dowse, PA-C  Casimiro Needle "Delton" [place], [place]

## 2019-08-18 NOTE — Progress Notes (Signed)
Patient Care Team: Larkin Ina as PCP - General (Physician Assistant)   HPI  Terri Ross is a 29 y.o. female seen in follow-up for orthostatic intolerance sinus tachycardia and elevated blood pressure in the context of rheumatoid arthritis and anxiety/depression secondary to unspecified PTSD  Followed closely by psychiatry/therapist  Revisit 12/20 discussed use of low-dose beta-blockers.  She was unable to tolerate the beta-blockers because of low blood pressure.  She showers 3 days a week because of significant shower intolerance.  She uses tepid water.  Showers at night.  Not using compressive wear.  "Cannot afford it "  Her identical twin sister is being evaluated for joint laxity   Records and Results Reviewed   Past Medical History:  Diagnosis Date  . Anxiety   . Arthritis   . Asthma   . Dizzy   . Panic attacks   . Rheumatoid arthritis (HCC)   . Tachycardia   . Urticaria     Past Surgical History:  Procedure Laterality Date  . NO PAST SURGERIES      Current Meds  Medication Sig  . albuterol (PROVENTIL HFA;VENTOLIN HFA) 108 (90 Base) MCG/ACT inhaler INHALE 2 PUFFS EVERY 6 HOURS AS NEEDED FOR WHEEZING OR SHORTNESS OF BREATH  . ALPRAZolam (XANAX XR) 1 MG 24 hr tablet Take 1 mg by mouth daily.  . ergocalciferol (VITAMIN D2) 1.25 MG (50000 UT) capsule Take 1 capsule by mouth once a week.  . hydrOXYzine (ATARAX/VISTARIL) 50 MG tablet TAKE 1 TO 2 TABLETS BY MOUTH EVERY 8 HOURS AS NEEDED FOR ANXIETY  . JUNEL FE 1/20 1-20 MG-MCG tablet Take 1 tablet by mouth daily.  Marland Kitchen loratadine (CLARITIN) 10 MG tablet Take 10 mg by mouth daily as needed for allergies.  . metaxalone (SKELAXIN) 800 MG tablet Take 800 mg by mouth 3 (three) times daily.  Marland Kitchen QVAR REDIHALER 80 MCG/ACT inhaler Inhale 1 puff into the lungs 2 (two) times daily.    Allergies  Allergen Reactions  . Voltaren [Diclofenac Sodium] Shortness Of Breath  . Doxycycline   . Lamotrigine Rash       Review of Systems negative except from HPI and PMH  Physical Exam BP (!) 136/91   Pulse 91   Ht 5\' 2"  (1.575 m)   Wt 196 lb (88.9 kg)   SpO2 97%   BMI 35.85 kg/m  Well developed and well nourished in no acute distress HENT normal E scleral and icterus clear Neck Supple JVP flat; carotids brisk and full Clear to ausculation Regular rate and rhythm, no murmurs gallops or rub Soft with active bowel sounds No clubbing cyanosis  Edema Alert and oriented, grossly normal motor and sensory function Skin Warm and Dry  ECG   CrCl cannot be calculated (Patient's most recent lab result is older than the maximum 21 days allowed.).   Assessment and  Plan Orthostatic intolerance  Sinus tachycardia  Anxiety/depression  Elevated blood pressure  Joint laxity   Continues to struggle with dizziness.  Shower intolerance remains a concern.  Still working on menses suppression.  Encouraged to continue exercise.  Again discussed compression.  She has a corset at home.  Has not been able to afford compression wear.  Her identical twin is being tested for "Ehlers-Danlos "patient Does have joint laxity.  Asked me to be more careful in word pictures     Current medicines are reviewed at length with the patient today .  The patient does not  have  concerns regarding medicines. \

## 2019-08-26 ENCOUNTER — Ambulatory Visit: Payer: BC Managed Care – PPO | Attending: Internal Medicine

## 2019-08-26 DIAGNOSIS — Z23 Encounter for immunization: Secondary | ICD-10-CM

## 2019-08-26 NOTE — Progress Notes (Signed)
   Covid-19 Vaccination Clinic  Name:  Lyann Hagstrom    MRN: [id number] DOB: 06-Jul-1990  08/26/2019  Ms. Surles was observed post Covid-19 immunization for 30 minutes based on pre-vaccination screening without incident. She was provided with Vaccine Information Sheet and instruction to access the V-Safe system.   Ms. Venturini was instructed to call 911 with any severe reactions post vaccine: Marland Kitchen Difficulty breathing  . Swelling of face and throat  . A fast heartbeat  . A bad rash all over body  . Dizziness and weakness   Immunizations Administered    Name Date Dose VIS Date Route   Pfizer COVID-19 Vaccine 08/26/2019 11:38 AM 0.3 mL 07/02/2018 Intramuscular   Manufacturer: [employer], [employer]   Lot: [id number]   NDC: [address]-4

## 2019-08-27 DIAGNOSIS — F411 Generalized anxiety disorder: Secondary | ICD-10-CM | POA: Diagnosis not present

## 2019-09-02 DIAGNOSIS — G253 Myoclonus: Secondary | ICD-10-CM | POA: Diagnosis not present

## 2019-09-02 DIAGNOSIS — Z87898 Personal history of other specified conditions: Secondary | ICD-10-CM | POA: Diagnosis not present

## 2019-09-03 DIAGNOSIS — F411 Generalized anxiety disorder: Secondary | ICD-10-CM | POA: Diagnosis not present

## 2019-09-09 DIAGNOSIS — Z9181 History of falling: Secondary | ICD-10-CM | POA: Diagnosis not present

## 2019-09-09 DIAGNOSIS — R42 Dizziness and giddiness: Secondary | ICD-10-CM | POA: Diagnosis not present

## 2019-09-09 DIAGNOSIS — R2689 Other abnormalities of gait and mobility: Secondary | ICD-10-CM | POA: Diagnosis not present

## 2019-09-10 DIAGNOSIS — F411 Generalized anxiety disorder: Secondary | ICD-10-CM | POA: Diagnosis not present

## 2019-09-17 DIAGNOSIS — F411 Generalized anxiety disorder: Secondary | ICD-10-CM | POA: Diagnosis not present

## 2019-09-24 DIAGNOSIS — Z87898 Personal history of other specified conditions: Secondary | ICD-10-CM | POA: Diagnosis not present

## 2019-09-24 DIAGNOSIS — R253 Fasciculation: Secondary | ICD-10-CM | POA: Diagnosis not present

## 2019-09-24 DIAGNOSIS — E559 Vitamin D deficiency, unspecified: Secondary | ICD-10-CM | POA: Diagnosis not present

## 2019-09-24 DIAGNOSIS — G253 Myoclonus: Secondary | ICD-10-CM | POA: Diagnosis not present

## 2019-09-24 DIAGNOSIS — F411 Generalized anxiety disorder: Secondary | ICD-10-CM | POA: Diagnosis not present

## 2019-09-29 ENCOUNTER — Ambulatory Visit (INDEPENDENT_AMBULATORY_CARE_PROVIDER_SITE_OTHER): Payer: BC Managed Care – PPO | Admitting: Registered Nurse

## 2019-09-29 ENCOUNTER — Encounter: Payer: Self-pay | Admitting: Registered Nurse

## 2019-09-29 ENCOUNTER — Other Ambulatory Visit: Payer: Self-pay

## 2019-09-29 VITALS — BP 131/94 | HR 109 | Temp 98.0°F | Resp 17 | Ht 62.0 in | Wt 198.4 lb

## 2019-09-29 DIAGNOSIS — B373 Candidiasis of vulva and vagina: Secondary | ICD-10-CM | POA: Diagnosis not present

## 2019-09-29 DIAGNOSIS — K047 Periapical abscess without sinus: Secondary | ICD-10-CM

## 2019-09-29 DIAGNOSIS — B3731 Acute candidiasis of vulva and vagina: Secondary | ICD-10-CM

## 2019-09-29 MED ORDER — FLUCONAZOLE 150 MG PO TABS: 150.0000 mg | ORAL_TABLET | Freq: Once | ORAL | 0 refills | Status: AC

## 2019-09-29 MED ORDER — AMOXICILLIN-POT CLAVULANATE 875-125 MG PO TABS: 1.0000 | ORAL_TABLET | Freq: Two times a day (BID) | ORAL | 0 refills | Status: DC

## 2019-09-29 MED ORDER — TRAMADOL HCL 50 MG PO TABS: 50.0000 mg | ORAL_TABLET | Freq: Three times a day (TID) | ORAL | 0 refills | Status: AC | PRN

## 2019-09-29 NOTE — Progress Notes (Signed)
Acute Office Visit  Subjective:    Patient ID: Terri Ross, female    DOB: [birth year], 29 y.o.   MRN: [id number]  Chief Complaint  Patient presents with  . Dental Pain    patient has a tooth on right bottom side that has been extremly painful. Patient thought she has food stuck in tooth but tooth seems to be infected because of the foul smell and taste in her mouth. She has took tylenol for pain but it is not helping.    HPI Patient is in today for abscessed tooth Bottom right molar, obviously broken - visibly red, swollen, tender. Has had abscessed tooth in past, pain is very similar.  Has had multiple instances of trauma to the face and head in the past but unfortunately does not have dental coverage and cannot afford dental work at this time.   Past Medical History:  Diagnosis Date  . Anxiety   . Arthritis   . Asthma   . Dizzy   . Panic attacks   . Rheumatoid arthritis (HCC)   . Tachycardia   . Urticaria     Past Surgical History:  Procedure Laterality Date  . NO PAST SURGERIES      Family History  Problem Relation Age of Onset  . Hypertension Father   . Anxiety disorder Father   . Asthma Sister   . Arthritis Sister   . Schizophrenia Sister   . Cancer Maternal Grandmother   . Cancer Paternal Grandmother        lung, POSITIVE TOBACCO    Social History   Socioeconomic History  . Marital status: Single    Spouse name: Not on file  . Number of children: 0  . Years of education: high school  . Highest education level: Not on file  Occupational History  . Occupation: security    Comment: [employer]  Tobacco Use  . Smoking status: Never Smoker  . Smokeless tobacco: Never Used  Substance and Sexual Activity  . Alcohol use: Yes    Comment: occ  . Drug use: No  . Sexual activity: Yes  Other Topics Concern  . Not on file  Social History Narrative   Originally from [place].   Was raised by her paternal grandparents after her biological mother abandoned her  and her twin sister as infants with their father, who was not able to raise them alone. They adopted the girls.   She ran away at age 69.    Talks to her father daily by phone.   Lives with 2 roommates.   Social Determinants of Health   Financial Resource Strain:   . Difficulty of Paying Living Expenses:   Food Insecurity:   . Worried About Programme researcher, broadcasting/film/video in the Last Year:   . Barista in the Last Year:   Transportation Needs:   . Freight forwarder (Medical):   Marland Kitchen Lack of Transportation (Non-Medical):   Physical Activity:   . Days of Exercise per Week:   . Minutes of Exercise per Session:   Stress:   . Feeling of Stress :   Social Connections:   . Frequency of Communication with Friends and Family:   . Frequency of Social Gatherings with Friends and Family:   . Attends Religious Services:   . Active Member of Clubs or Organizations:   . Attends Banker Meetings:   Marland Kitchen Marital Status:   Intimate Partner Violence:   . Fear of Current or Ex-Partner:   .  Emotionally Abused:   Marland Kitchen Physically Abused:   . Sexually Abused:     Outpatient Medications Prior to Visit  Medication Sig Dispense Refill  . albuterol (PROVENTIL HFA;VENTOLIN HFA) 108 (90 Base) MCG/ACT inhaler INHALE 2 PUFFS EVERY 6 HOURS AS NEEDED FOR WHEEZING OR SHORTNESS OF BREATH 8.5 Inhaler 6  . ALPRAZolam (XANAX XR) 1 MG 24 hr tablet Take 1 mg by mouth daily.    . ergocalciferol (VITAMIN D2) 1.25 MG (50000 UT) capsule Take 1 capsule by mouth once a week.    . hydrOXYzine (ATARAX/VISTARIL) 50 MG tablet TAKE 1 TO 2 TABLETS BY MOUTH EVERY 8 HOURS AS NEEDED FOR ANXIETY 30 tablet 0  . JUNEL FE 1/20 1-20 MG-MCG tablet Take 1 tablet by mouth daily.    Marland Kitchen loratadine (CLARITIN) 10 MG tablet Take 10 mg by mouth daily as needed for allergies.    . metaxalone (SKELAXIN) 800 MG tablet Take 800 mg by mouth 3 (three) times daily.    Marland Kitchen QVAR REDIHALER 80 MCG/ACT inhaler Inhale 1 puff into the lungs 2 (two) times  daily.     No facility-administered medications prior to visit.    Allergies  Allergen Reactions  . Voltaren [Diclofenac Sodium] Shortness Of Breath  . Doxycycline   . Lamotrigine Rash    Review of Systems  Constitutional: Negative.   HENT: Positive for dental problem. Negative for congestion, drooling, ear discharge, ear pain, facial swelling, hearing loss, mouth sores, nosebleeds, postnasal drip, rhinorrhea, sinus pressure, sinus pain, sneezing, sore throat, tinnitus, trouble swallowing and voice change.   Eyes: Negative.   Respiratory: Negative.   Cardiovascular: Negative.   Gastrointestinal: Negative.   Endocrine: Negative.   Genitourinary: Negative.   Musculoskeletal: Negative.   Skin: Negative.   Allergic/Immunologic: Negative.   Neurological: Negative.   Hematological: Negative.   Psychiatric/Behavioral: Negative.   All other systems reviewed and are negative.      Objective:    Physical Exam Vitals and nursing note reviewed.  Constitutional:      General: She is not in acute distress.    Appearance: Normal appearance. She is normal weight. She is not ill-appearing, toxic-appearing or diaphoretic.  HENT:     Head: Battle's sign present.  Cardiovascular:     Rate and Rhythm: Normal rate and regular rhythm.     Pulses: Normal pulses.     Heart sounds: No murmur.  Skin:    Capillary Refill: Capillary refill takes less than 2 seconds.  Neurological:     General: No focal deficit present.     Mental Status: She is alert and oriented to person, place, and time. Mental status is at baseline.  Psychiatric:        Mood and Affect: Mood normal.        Behavior: Behavior normal.        Thought Content: Thought content normal.        Judgment: Judgment normal.     BP (!) 131/94   Pulse (!) 109   Temp 98 F (36.7 C) (Temporal)   Resp 17   Ht 5\' 2"  (1.575 m)   Wt 198 lb 6.4 oz (90 kg)   LMP 09/13/2019   SpO2 95%   BMI 36.29 kg/m  Wt Readings from Last 3  Encounters:  09/29/19 198 lb 6.4 oz (90 kg)  08/18/19 196 lb (88.9 kg)  05/06/19 197 lb 6.4 oz (89.5 kg)    There are no preventive care reminders to display for this patient.  There are no preventive care reminders to display for this patient.   No results found for: TSH Lab Results  Component Value Date   WBC 11.2 (H) 03/16/2019   HGB 15.3 (H) 03/16/2019   HCT 46.6 (H) 03/16/2019   MCV 86.3 03/16/2019   PLT 349 03/16/2019   Lab Results  Component Value Date   NA 137 03/16/2019   K 4.1 03/16/2019   CO2 20 (L) 03/16/2019   GLUCOSE 94 03/16/2019   BUN 7 03/16/2019   CREATININE 1.05 (H) 03/16/2019   BILITOT 0.5 08/16/2014   ALKPHOS 80 08/16/2014   AST 18 08/16/2014   ALT 24 08/16/2014   PROT 8.7 (H) 08/16/2014   ALBUMIN 4.6 08/16/2014   CALCIUM 9.1 03/16/2019   ANIONGAP 11 03/16/2019   No results found for: CHOL No results found for: HDL No results found for: LDLCALC No results found for: TRIG No results found for: CHOLHDL No results found for: GOTL5B     Assessment & Plan:   Problem List Items Addressed This Visit    None    Visit Diagnoses    Abscessed tooth    -  Primary   Relevant Medications   amoxicillin-clavulanate (AUGMENTIN) 875-125 MG tablet   traMADol (ULTRAM) 50 MG tablet   Vaginal candidiasis       Relevant Medications   fluconazole (DIFLUCAN) 150 MG tablet       Meds ordered this encounter  Medications  . amoxicillin-clavulanate (AUGMENTIN) 875-125 MG tablet    Sig: Take 1 tablet by mouth 2 (two) times daily.    Dispense:  20 tablet    Refill:  0    Order Specific Question:   Supervising Provider    Answer:   Collie Siad A [id number]  . traMADol (ULTRAM) 50 MG tablet    Sig: Take 1 tablet (50 mg total) by mouth every 8 (eight) hours as needed for up to 5 days.    Dispense:  15 tablet    Refill:  0    Order Specific Question:   Supervising Provider    Answer:   Collie Siad A [id number]  . fluconazole (DIFLUCAN) 150 MG tablet     Sig: Take 1 tablet (150 mg total) by mouth once for 1 dose.    Dispense:  1 tablet    Refill:  0    Order Specific Question:   Supervising Provider    Answer:   Doristine Bosworth [id number]   PLAN  augmentin po bid for 10 days  Diflucan for resulting vaginal candidiasis  Tramadol for pain  Suggest dental work - look into sliding scale clinics, low cost clinics  Patient encouraged to call clinic with any questions, comments, or concerns.   Janeece Agee, NP

## 2019-09-29 NOTE — Patient Instructions (Signed)
° ° ° °  If you have lab work done today you will be contacted with your lab results within the next 2 weeks.  If you have not heard from us then please contact us. The fastest way to get your results is to register for My Chart. ° ° °IF you received an x-ray today, you will receive an invoice from [facility]. Please contact [facility] at [phone] with questions or concerns regarding your invoice.  ° °IF you received labwork today, you will receive an invoice from [facility]. Please contact [facility] at [phone] with questions or concerns regarding your invoice.  ° °Our billing staff will not be able to assist you with questions regarding bills from these companies. ° °You will be contacted with the lab results as soon as they are available. The fastest way to get your results is to activate your My Chart account. Instructions are located on the last page of this paperwork. If you have not heard from us regarding the results in 2 weeks, please contact this office. °  ° ° ° °

## 2019-10-01 DIAGNOSIS — F411 Generalized anxiety disorder: Secondary | ICD-10-CM | POA: Diagnosis not present

## 2019-10-08 DIAGNOSIS — F411 Generalized anxiety disorder: Secondary | ICD-10-CM | POA: Diagnosis not present

## 2019-10-15 DIAGNOSIS — F411 Generalized anxiety disorder: Secondary | ICD-10-CM | POA: Diagnosis not present

## 2019-10-21 DIAGNOSIS — R000 Tachycardia, unspecified: Secondary | ICD-10-CM | POA: Diagnosis not present

## 2019-10-21 DIAGNOSIS — F411 Generalized anxiety disorder: Secondary | ICD-10-CM | POA: Diagnosis not present

## 2019-10-21 DIAGNOSIS — F4310 Post-traumatic stress disorder, unspecified: Secondary | ICD-10-CM | POA: Diagnosis not present

## 2019-10-21 DIAGNOSIS — R42 Dizziness and giddiness: Secondary | ICD-10-CM | POA: Diagnosis not present

## 2019-10-22 DIAGNOSIS — F411 Generalized anxiety disorder: Secondary | ICD-10-CM | POA: Diagnosis not present

## 2019-10-24 DIAGNOSIS — Z20822 Contact with and (suspected) exposure to covid-19: Secondary | ICD-10-CM | POA: Diagnosis not present

## 2019-10-24 DIAGNOSIS — Z03818 Encounter for observation for suspected exposure to other biological agents ruled out: Secondary | ICD-10-CM | POA: Diagnosis not present

## 2019-10-29 DIAGNOSIS — F411 Generalized anxiety disorder: Secondary | ICD-10-CM | POA: Diagnosis not present

## 2019-11-05 DIAGNOSIS — F411 Generalized anxiety disorder: Secondary | ICD-10-CM | POA: Diagnosis not present

## 2019-11-12 DIAGNOSIS — F411 Generalized anxiety disorder: Secondary | ICD-10-CM | POA: Diagnosis not present

## 2019-11-13 DIAGNOSIS — R000 Tachycardia, unspecified: Secondary | ICD-10-CM | POA: Diagnosis not present

## 2019-11-19 DIAGNOSIS — F411 Generalized anxiety disorder: Secondary | ICD-10-CM | POA: Diagnosis not present

## 2019-11-26 DIAGNOSIS — F411 Generalized anxiety disorder: Secondary | ICD-10-CM | POA: Diagnosis not present

## 2019-12-03 DIAGNOSIS — F411 Generalized anxiety disorder: Secondary | ICD-10-CM | POA: Diagnosis not present

## 2019-12-10 DIAGNOSIS — F411 Generalized anxiety disorder: Secondary | ICD-10-CM | POA: Diagnosis not present

## 2019-12-17 DIAGNOSIS — F411 Generalized anxiety disorder: Secondary | ICD-10-CM | POA: Diagnosis not present

## 2019-12-24 DIAGNOSIS — F411 Generalized anxiety disorder: Secondary | ICD-10-CM | POA: Diagnosis not present

## 2019-12-31 DIAGNOSIS — F411 Generalized anxiety disorder: Secondary | ICD-10-CM | POA: Diagnosis not present

## 2020-01-07 DIAGNOSIS — F411 Generalized anxiety disorder: Secondary | ICD-10-CM | POA: Diagnosis not present

## 2020-01-14 DIAGNOSIS — F411 Generalized anxiety disorder: Secondary | ICD-10-CM | POA: Diagnosis not present

## 2020-01-14 IMAGING — DX DG CHEST 1V PORT
1 series · 1 of 1 positions shown · non-contrast
Comparison: 01/21/2010

CLINICAL DATA: Shortness of breath

EXAM:
PORTABLE CHEST 1 VIEW

[chest ap]
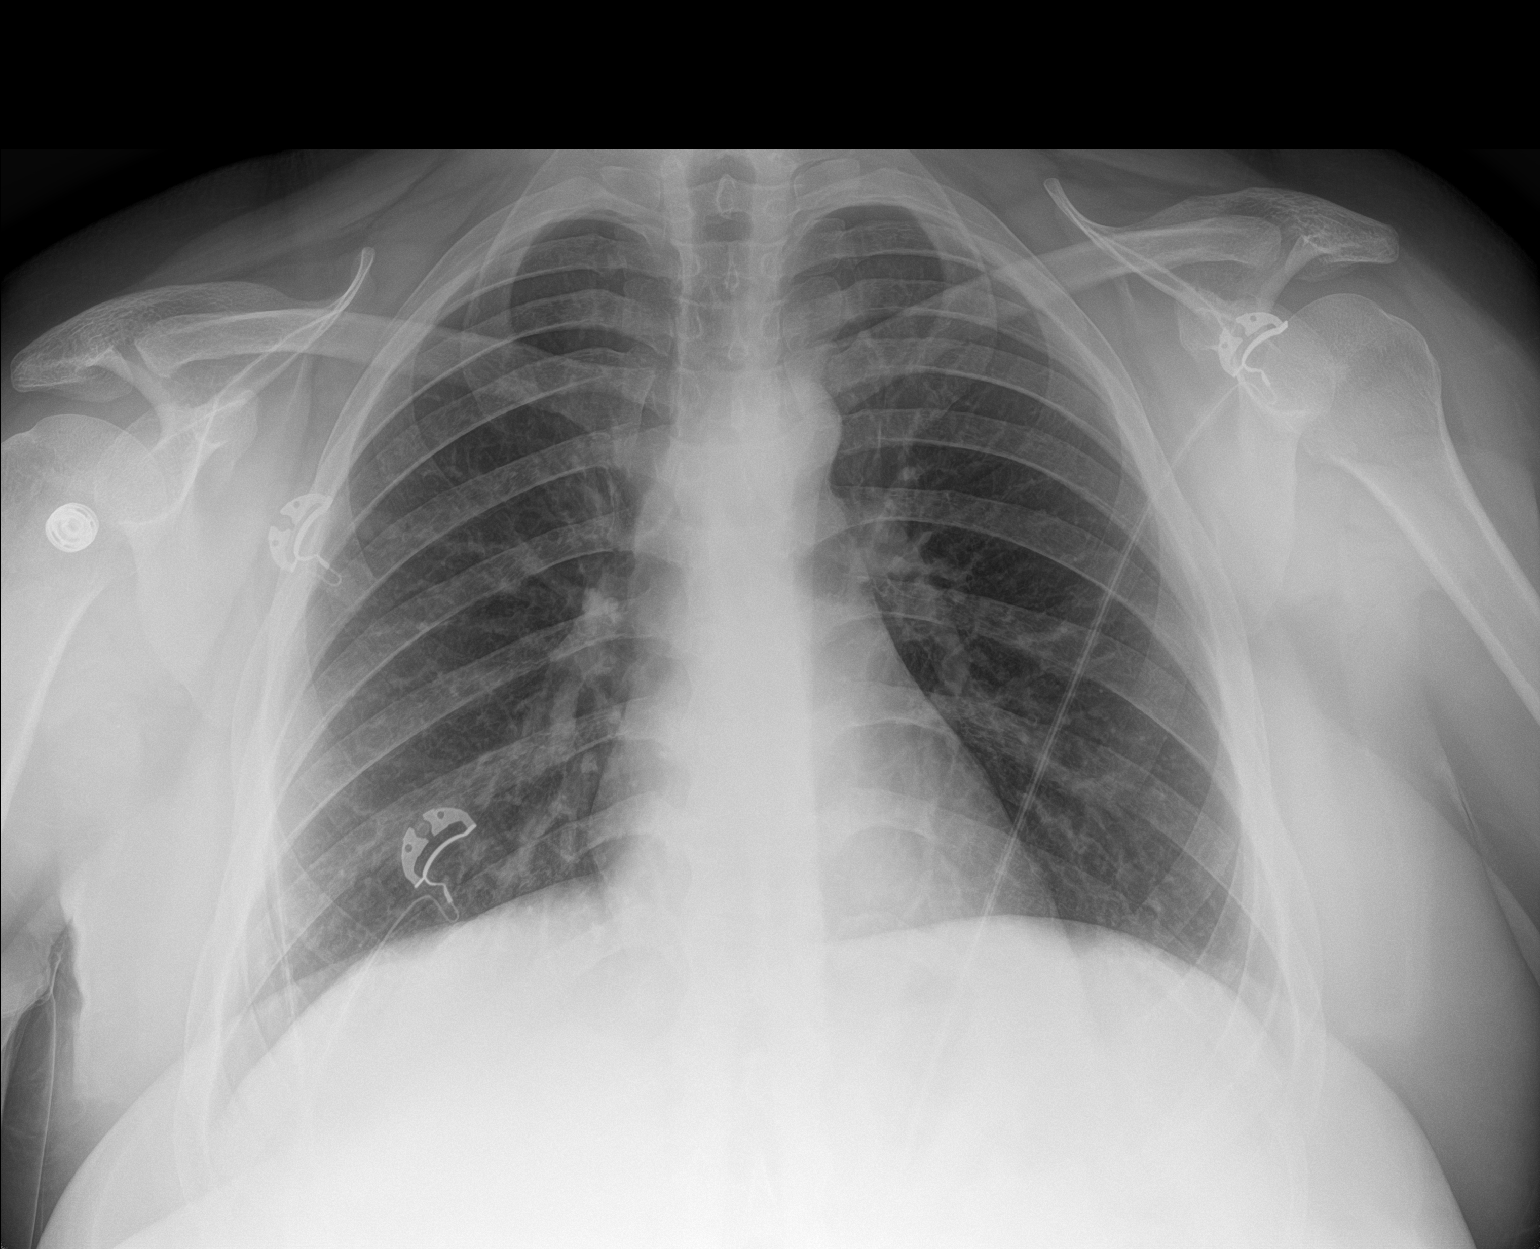

[1 of 1 positions shown; findings below may reference images not displayed]

FINDINGS: The heart size and mediastinal contours are within normal limits.
Both lungs are clear. The visualized skeletal structures are
unremarkable.
IMPRESSION: No acute abnormality of the lungs in AP portable projection.

## 2020-01-16 DIAGNOSIS — I951 Orthostatic hypotension: Secondary | ICD-10-CM | POA: Diagnosis not present

## 2020-01-20 DIAGNOSIS — F411 Generalized anxiety disorder: Secondary | ICD-10-CM | POA: Diagnosis not present

## 2020-01-20 DIAGNOSIS — F4310 Post-traumatic stress disorder, unspecified: Secondary | ICD-10-CM | POA: Diagnosis not present

## 2020-01-20 DIAGNOSIS — I951 Orthostatic hypotension: Secondary | ICD-10-CM | POA: Diagnosis not present

## 2020-01-20 DIAGNOSIS — J4520 Mild intermittent asthma, uncomplicated: Secondary | ICD-10-CM | POA: Diagnosis not present

## 2020-01-21 DIAGNOSIS — F411 Generalized anxiety disorder: Secondary | ICD-10-CM | POA: Diagnosis not present

## 2020-01-24 DIAGNOSIS — Z20828 Contact with and (suspected) exposure to other viral communicable diseases: Secondary | ICD-10-CM | POA: Diagnosis not present

## 2020-01-28 DIAGNOSIS — F411 Generalized anxiety disorder: Secondary | ICD-10-CM | POA: Diagnosis not present

## 2020-02-04 DIAGNOSIS — F411 Generalized anxiety disorder: Secondary | ICD-10-CM | POA: Diagnosis not present

## 2020-02-11 DIAGNOSIS — F411 Generalized anxiety disorder: Secondary | ICD-10-CM | POA: Diagnosis not present

## 2020-02-25 DIAGNOSIS — F411 Generalized anxiety disorder: Secondary | ICD-10-CM | POA: Diagnosis not present

## 2020-03-01 DIAGNOSIS — I498 Other specified cardiac arrhythmias: Secondary | ICD-10-CM | POA: Diagnosis not present

## 2020-03-01 DIAGNOSIS — R002 Palpitations: Secondary | ICD-10-CM | POA: Diagnosis not present

## 2020-03-01 DIAGNOSIS — R000 Tachycardia, unspecified: Secondary | ICD-10-CM | POA: Diagnosis not present

## 2020-03-02 DIAGNOSIS — R000 Tachycardia, unspecified: Secondary | ICD-10-CM | POA: Diagnosis not present

## 2020-03-02 DIAGNOSIS — I498 Other specified cardiac arrhythmias: Secondary | ICD-10-CM | POA: Diagnosis not present

## 2020-03-03 DIAGNOSIS — F411 Generalized anxiety disorder: Secondary | ICD-10-CM | POA: Diagnosis not present

## 2020-03-09 DIAGNOSIS — I498 Other specified cardiac arrhythmias: Secondary | ICD-10-CM | POA: Diagnosis not present

## 2020-03-09 DIAGNOSIS — R000 Tachycardia, unspecified: Secondary | ICD-10-CM | POA: Diagnosis not present

## 2020-03-10 DIAGNOSIS — F411 Generalized anxiety disorder: Secondary | ICD-10-CM | POA: Diagnosis not present

## 2020-03-11 DIAGNOSIS — I498 Other specified cardiac arrhythmias: Secondary | ICD-10-CM | POA: Diagnosis not present

## 2020-03-17 DIAGNOSIS — F411 Generalized anxiety disorder: Secondary | ICD-10-CM | POA: Diagnosis not present

## 2020-03-23 DIAGNOSIS — F410 Panic disorder [episodic paroxysmal anxiety] without agoraphobia: Secondary | ICD-10-CM | POA: Diagnosis not present

## 2020-03-23 DIAGNOSIS — F321 Major depressive disorder, single episode, moderate: Secondary | ICD-10-CM | POA: Diagnosis not present

## 2020-03-23 DIAGNOSIS — F411 Generalized anxiety disorder: Secondary | ICD-10-CM | POA: Diagnosis not present

## 2020-03-23 DIAGNOSIS — F4310 Post-traumatic stress disorder, unspecified: Secondary | ICD-10-CM | POA: Diagnosis not present

## 2020-03-24 DIAGNOSIS — F411 Generalized anxiety disorder: Secondary | ICD-10-CM | POA: Diagnosis not present

## 2020-03-31 DIAGNOSIS — F411 Generalized anxiety disorder: Secondary | ICD-10-CM | POA: Diagnosis not present

## 2020-04-07 DIAGNOSIS — F411 Generalized anxiety disorder: Secondary | ICD-10-CM | POA: Diagnosis not present

## 2020-04-14 DIAGNOSIS — F411 Generalized anxiety disorder: Secondary | ICD-10-CM | POA: Diagnosis not present

## 2020-04-21 DIAGNOSIS — F411 Generalized anxiety disorder: Secondary | ICD-10-CM | POA: Diagnosis not present

## 2022-07-20 ENCOUNTER — Other Ambulatory Visit: Payer: Self-pay

## 2022-07-20 ENCOUNTER — Observation Stay (HOSPITAL_COMMUNITY)
Admission: EM | Admit: 2022-07-20 | Discharge: 2022-07-22 | Disposition: A | Discharge status: Home / Self Care | Payer: BC Managed Care – PPO | Attending: General Surgery | Admitting: General Surgery

## 2022-07-20 ENCOUNTER — Emergency Department (HOSPITAL_COMMUNITY): Payer: BC Managed Care – PPO

## 2022-07-20 ENCOUNTER — Encounter (HOSPITAL_COMMUNITY): Payer: Self-pay

## 2022-07-20 DIAGNOSIS — R1031 Right lower quadrant pain: Secondary | ICD-10-CM | POA: Diagnosis present

## 2022-07-20 DIAGNOSIS — K3580 Unspecified acute appendicitis: Secondary | ICD-10-CM | POA: Diagnosis present

## 2022-07-20 DIAGNOSIS — Z79899 Other long term (current) drug therapy: Secondary | ICD-10-CM | POA: Insufficient documentation

## 2022-07-20 DIAGNOSIS — K353 Acute appendicitis with localized peritonitis, without perforation or gangrene: Secondary | ICD-10-CM | POA: Diagnosis not present

## 2022-07-20 DIAGNOSIS — J45909 Unspecified asthma, uncomplicated: Secondary | ICD-10-CM | POA: Diagnosis not present

## 2022-07-20 LAB — COMPREHENSIVE METABOLIC PANEL
ALT: 29 U/L (ref 0–44)
AST: 19 U/L (ref 15–41)
Albumin: 4.1 g/dL (ref 3.5–5.0)
Alkaline Phosphatase: 77 U/L (ref 38–126)
Anion gap: 11 (ref 5–15)
BUN: 6 mg/dL (ref 6–20)
CO2: 22 mmol/L (ref 22–32)
Calcium: 9.1 mg/dL (ref 8.9–10.3)
Chloride: 104 mmol/L (ref 98–111)
Creatinine, Ser: 0.86 mg/dL (ref 0.44–1.00)
GFR, Estimated: >60 mL/min (ref >60)
Glucose, Bld: 94 mg/dL (ref 70–99)
Potassium: 3.1 mmol/L — ABNORMAL LOW (ref 3.5–5.1)
Sodium: 137 mmol/L (ref 135–145)
Total Bilirubin: 0.8 mg/dL (ref 0.3–1.2)
Total Protein: 8 g/dL (ref 6.5–8.1)

## 2022-07-20 LAB — URINALYSIS, ROUTINE W REFLEX MICROSCOPIC
Bilirubin Urine: NEGATIVE
Glucose, UA: NEGATIVE mg/dL
Hgb urine dipstick: NEGATIVE
Ketones, ur: NEGATIVE mg/dL
Leukocytes,Ua: NEGATIVE
Nitrite: NEGATIVE
Protein, ur: NEGATIVE mg/dL
Specific Gravity, Urine: 1.016 (ref 1.005–1.030)
pH: 6 (ref 5.0–8.0)

## 2022-07-20 LAB — CBC
HCT: 44.3 % (ref 36.0–46.0)
Hemoglobin: 14.6 g/dL (ref 12.0–15.0)
MCH: 29.4 pg (ref 26.0–34.0)
MCHC: 33 g/dL (ref 30.0–36.0)
MCV: 89.3 fL (ref 80.0–100.0)
Platelets: 405 10*3/uL — ABNORMAL HIGH (ref 150–400)
RBC: 4.96 MIL/uL (ref 3.87–5.11)
RDW: 11.9 % (ref 11.5–15.5)
WBC: 12.6 10*3/uL — ABNORMAL HIGH (ref 4.0–10.5)
nRBC: 0 % (ref 0.0–0.2)

## 2022-07-20 LAB — I-STAT BETA HCG BLOOD, ED (MC, WL, AP ONLY): I-stat hCG, quantitative: <5 m[IU]/mL (ref <5)

## 2022-07-20 LAB — LIPASE, BLOOD: Lipase: 28 U/L (ref 11–51)

## 2022-07-20 MED ORDER — SODIUM CHLORIDE 0.9 % IV SOLN
2.0000 g | Freq: Once | INTRAVENOUS | Status: AC
  Administered 2022-07-20: 2 g via INTRAVENOUS
  Filled 2022-07-20: qty 20

## 2022-07-20 MED ORDER — SODIUM CHLORIDE 0.9 % IV BOLUS (SEPSIS)
1000.0000 mL | Freq: Once | INTRAVENOUS | Status: AC
  Administered 2022-07-20: 1000 mL via INTRAVENOUS

## 2022-07-20 MED ORDER — IOHEXOL 350 MG/ML SOLN
75.0000 mL | Freq: Once | INTRAVENOUS | Status: AC | PRN
  Administered 2022-07-20: 75 mL via INTRAVENOUS

## 2022-07-20 MED ORDER — FENTANYL CITRATE PF 50 MCG/ML IJ SOSY
50.0000 ug | PREFILLED_SYRINGE | INTRAMUSCULAR | Status: DC | PRN
  Administered 2022-07-20 – 2022-07-21 (×2): 50 ug via INTRAVENOUS
  Filled 2022-07-20 (×2): qty 1

## 2022-07-20 MED ORDER — METRONIDAZOLE 500 MG/100ML IV SOLN
500.0000 mg | Freq: Once | INTRAVENOUS | Status: AC
  Administered 2022-07-21: 500 mg via INTRAVENOUS
  Filled 2022-07-20: qty 100

## 2022-07-20 MED ORDER — SODIUM CHLORIDE 0.9 % IV SOLN
1000.0000 mL | INTRAVENOUS | Status: DC
  Administered 2022-07-21: 1000 mL via INTRAVENOUS

## 2022-07-20 NOTE — ED Triage Notes (Signed)
RLQ abdominal pain beginning ~0530 today. Pain worst when palpated.   Seen at [facility] and sent in for appendix study.

## 2022-07-20 NOTE — ED Provider Triage Note (Signed)
Emergency Medicine Provider Triage Evaluation Note  Terri Ross , a 32 y.o. female  was evaluated in triage.  Patient complains of abdominal pain that started at 530 this morning.  Denies any nausea or vomiting.  Reports an episode of diarrhea last night.  Does state that she has had a decreased appetite over the past few days.  Currently on menstrual period.  Was seen at urgent care and sent here to rule out appendectomy  Review of Systems  Positive:  Negative:   Physical Exam  BP (!) 128/96 (BP Location: Right Arm)   Pulse (!) 103   Temp 97.6 F (36.4 C)   Resp 16   SpO2 100%  Gen:   Awake, no distress   Resp:  Normal effort  MSK:   Moves extremities without difficulty  Other:  + McBurney's  Medical Decision Making  Medically screening exam initiated at 7:28 PM.  Appropriate orders placed.  Terri Ross was informed that the remainder of the evaluation will be completed by another provider, this initial triage assessment does not replace that evaluation, and the importance of remaining in the ED until their evaluation is complete.     Terri Ross, [place] 07/20/22 1929

## 2022-07-21 ENCOUNTER — Encounter (HOSPITAL_COMMUNITY)
Admission: EM | Disposition: A | Discharge status: Home / Self Care | Payer: Self-pay | Source: Home / Self Care | Attending: Emergency Medicine

## 2022-07-21 ENCOUNTER — Other Ambulatory Visit: Payer: Self-pay

## 2022-07-21 ENCOUNTER — Observation Stay (HOSPITAL_COMMUNITY): Payer: BC Managed Care – PPO | Admitting: Certified Registered Nurse Anesthetist

## 2022-07-21 ENCOUNTER — Encounter (HOSPITAL_COMMUNITY): Payer: Self-pay | Admitting: Surgery

## 2022-07-21 DIAGNOSIS — K3580 Unspecified acute appendicitis: Secondary | ICD-10-CM | POA: Diagnosis present

## 2022-07-21 HISTORY — PX: LAPAROSCOPIC APPENDECTOMY: SHX408

## 2022-07-21 LAB — HIV ANTIBODY (ROUTINE TESTING W REFLEX): HIV Screen 4th Generation wRfx: NONREACTIVE

## 2022-07-21 SURGERY — APPENDECTOMY, LAPAROSCOPIC
Anesthesia: General | Site: Abdomen

## 2022-07-21 MED ORDER — HYDROMORPHONE HCL 1 MG/ML IJ SOLN
INTRAMUSCULAR | Status: AC
  Filled 2022-07-21: qty 1

## 2022-07-21 MED ORDER — DEXMEDETOMIDINE HCL IN NACL 80 MCG/20ML IV SOLN
INTRAVENOUS | Status: DC | PRN
  Administered 2022-07-21 (×2): 8 ug via BUCCAL
  Administered 2022-07-21: 4 ug via BUCCAL

## 2022-07-21 MED ORDER — BUDESONIDE 0.25 MG/2ML IN SUSP
0.2500 mg | Freq: Two times a day (BID) | RESPIRATORY_TRACT | Status: DC
  Administered 2022-07-21 – 2022-07-22 (×3): 0.25 mg via RESPIRATORY_TRACT
  Filled 2022-07-21 (×3): qty 2

## 2022-07-21 MED ORDER — ALBUTEROL SULFATE HFA 108 (90 BASE) MCG/ACT IN AERS
INHALATION_SPRAY | RESPIRATORY_TRACT | Status: DC | PRN
  Administered 2022-07-21 (×2): 4 via RESPIRATORY_TRACT

## 2022-07-21 MED ORDER — SUCCINYLCHOLINE CHLORIDE 200 MG/10ML IV SOSY
PREFILLED_SYRINGE | INTRAVENOUS | Status: DC | PRN
  Administered 2022-07-21: 140 mg via INTRAVENOUS

## 2022-07-21 MED ORDER — OXYCODONE HCL 5 MG/5ML PO SOLN: 5.0000 mg | Freq: Once | ORAL | Status: DC | PRN

## 2022-07-21 MED ORDER — ALPRAZOLAM 0.5 MG PO TABS
0.5000 mg | ORAL_TABLET | Freq: Two times a day (BID) | ORAL | Status: DC
  Administered 2022-07-21 – 2022-07-22 (×3): 0.5 mg via ORAL
  Filled 2022-07-21 (×3): qty 1

## 2022-07-21 MED ORDER — SODIUM CHLORIDE 0.9 % IR SOLN
Status: DC | PRN
  Administered 2022-07-21: 1000 mL

## 2022-07-21 MED ORDER — ACETAMINOPHEN 500 MG PO TABS
1000.0000 mg | ORAL_TABLET | Freq: Four times a day (QID) | ORAL | Status: DC
  Administered 2022-07-21 – 2022-07-22 (×4): 1000 mg via ORAL
  Filled 2022-07-21 (×4): qty 2

## 2022-07-21 MED ORDER — PROPOFOL 10 MG/ML IV BOLUS
INTRAVENOUS | Status: DC | PRN
  Administered 2022-07-21: 200 mg via INTRAVENOUS

## 2022-07-21 MED ORDER — ONDANSETRON 4 MG PO TBDP: 4.0000 mg | ORAL_TABLET | Freq: Four times a day (QID) | ORAL | Status: DC | PRN

## 2022-07-21 MED ORDER — FENTANYL CITRATE (PF) 250 MCG/5ML IJ SOLN
INTRAMUSCULAR | Status: AC
  Filled 2022-07-21: qty 5

## 2022-07-21 MED ORDER — DOCUSATE SODIUM 100 MG PO CAPS
100.0000 mg | ORAL_CAPSULE | Freq: Two times a day (BID) | ORAL | Status: DC
  Administered 2022-07-21 – 2022-07-22 (×3): 100 mg via ORAL
  Filled 2022-07-21 (×4): qty 1

## 2022-07-21 MED ORDER — LACTATED RINGERS IV SOLN: INTRAVENOUS | Status: DC

## 2022-07-21 MED ORDER — DEXMEDETOMIDINE HCL IN NACL 80 MCG/20ML IV SOLN
INTRAVENOUS | Status: AC
  Filled 2022-07-21: qty 20

## 2022-07-21 MED ORDER — ONDANSETRON HCL 4 MG/2ML IJ SOLN
INTRAMUSCULAR | Status: AC
  Filled 2022-07-21: qty 2

## 2022-07-21 MED ORDER — BUPIVACAINE-EPINEPHRINE 0.25% -1:200000 IJ SOLN
INTRAMUSCULAR | Status: DC | PRN
  Administered 2022-07-21: 22 mL

## 2022-07-21 MED ORDER — SUGAMMADEX SODIUM 200 MG/2ML IV SOLN
INTRAVENOUS | Status: DC | PRN
  Administered 2022-07-21: 400 mg via INTRAVENOUS

## 2022-07-21 MED ORDER — ONDANSETRON HCL 4 MG/2ML IJ SOLN: 4.0000 mg | Freq: Once | INTRAMUSCULAR | Status: DC | PRN

## 2022-07-21 MED ORDER — MIDAZOLAM HCL 2 MG/2ML IJ SOLN
INTRAMUSCULAR | Status: AC
  Filled 2022-07-21: qty 2

## 2022-07-21 MED ORDER — ONDANSETRON HCL 4 MG/2ML IJ SOLN
4.0000 mg | Freq: Four times a day (QID) | INTRAMUSCULAR | Status: DC | PRN
  Administered 2022-07-21: 4 mg via INTRAVENOUS
  Filled 2022-07-21: qty 2

## 2022-07-21 MED ORDER — ROCURONIUM BROMIDE 10 MG/ML (PF) SYRINGE
PREFILLED_SYRINGE | INTRAVENOUS | Status: AC
  Filled 2022-07-21: qty 10

## 2022-07-21 MED ORDER — MIDAZOLAM HCL 5 MG/5ML IJ SOLN
INTRAMUSCULAR | Status: DC | PRN
  Administered 2022-07-21: 2 mg via INTRAVENOUS

## 2022-07-21 MED ORDER — ENOXAPARIN SODIUM 40 MG/0.4ML IJ SOSY
40.0000 mg | PREFILLED_SYRINGE | INTRAMUSCULAR | Status: DC
  Administered 2022-07-21: 40 mg via SUBCUTANEOUS
  Filled 2022-07-21: qty 0.4

## 2022-07-21 MED ORDER — OXYCODONE HCL 5 MG PO TABS: 5.0000 mg | ORAL_TABLET | Freq: Once | ORAL | Status: DC | PRN

## 2022-07-21 MED ORDER — POTASSIUM CHLORIDE CRYS ER 20 MEQ PO TBCR
40.0000 meq | EXTENDED_RELEASE_TABLET | Freq: Once | ORAL | Status: AC
  Administered 2022-07-21: 40 meq via ORAL
  Filled 2022-07-21: qty 2

## 2022-07-21 MED ORDER — HYDROMORPHONE HCL 1 MG/ML IJ SOLN
0.2500 mg | INTRAMUSCULAR | Status: DC | PRN
  Administered 2022-07-21 (×4): 0.5 mg via INTRAVENOUS

## 2022-07-21 MED ORDER — SCOPOLAMINE 1 MG/3DAYS TD PT72
MEDICATED_PATCH | TRANSDERMAL | Status: AC
  Administered 2022-07-21: 1.5 mg via TRANSDERMAL
  Filled 2022-07-21: qty 1

## 2022-07-21 MED ORDER — OXYCODONE HCL 5 MG PO TABS
5.0000 mg | ORAL_TABLET | ORAL | Status: DC | PRN
  Administered 2022-07-21 – 2022-07-22 (×3): 5 mg via ORAL
  Filled 2022-07-21 (×3): qty 1

## 2022-07-21 MED ORDER — SCOPOLAMINE 1 MG/3DAYS TD PT72: 1.0000 | MEDICATED_PATCH | TRANSDERMAL | Status: DC

## 2022-07-21 MED ORDER — ROCURONIUM BROMIDE 10 MG/ML (PF) SYRINGE
PREFILLED_SYRINGE | INTRAVENOUS | Status: DC | PRN
  Administered 2022-07-21: 50 mg via INTRAVENOUS

## 2022-07-21 MED ORDER — FENTANYL CITRATE (PF) 250 MCG/5ML IJ SOLN
INTRAMUSCULAR | Status: DC | PRN
  Administered 2022-07-21: 150 ug via INTRAVENOUS
  Administered 2022-07-21: 100 ug via INTRAVENOUS

## 2022-07-21 MED ORDER — DIPHENHYDRAMINE HCL 50 MG/ML IJ SOLN: 25.0000 mg | Freq: Four times a day (QID) | INTRAMUSCULAR | Status: DC | PRN

## 2022-07-21 MED ORDER — VENLAFAXINE HCL ER 150 MG PO CP24
150.0000 mg | ORAL_CAPSULE | Freq: Every day | ORAL | Status: DC
  Filled 2022-07-21 (×2): qty 1

## 2022-07-21 MED ORDER — HYDROMORPHONE HCL 1 MG/ML IJ SOLN
0.5000 mg | INTRAMUSCULAR | Status: DC | PRN
  Administered 2022-07-21 – 2022-07-22 (×3): 0.5 mg via INTRAVENOUS
  Filled 2022-07-21 (×3): qty 0.5

## 2022-07-21 MED ORDER — DIPHENHYDRAMINE HCL 25 MG PO CAPS: 25.0000 mg | ORAL_CAPSULE | Freq: Four times a day (QID) | ORAL | Status: DC | PRN

## 2022-07-21 MED ORDER — ONDANSETRON HCL 4 MG/2ML IJ SOLN
INTRAMUSCULAR | Status: DC | PRN
  Administered 2022-07-21: 4 mg via INTRAVENOUS

## 2022-07-21 MED ORDER — PROPOFOL 10 MG/ML IV BOLUS
INTRAVENOUS | Status: AC
  Filled 2022-07-21: qty 20

## 2022-07-21 MED ORDER — LIDOCAINE 2% (20 MG/ML) 5 ML SYRINGE
INTRAMUSCULAR | Status: DC | PRN
  Administered 2022-07-21: 80 mg via INTRAVENOUS

## 2022-07-21 MED ORDER — 0.9 % SODIUM CHLORIDE (POUR BTL) OPTIME
TOPICAL | Status: DC | PRN
  Administered 2022-07-21: 1000 mL

## 2022-07-21 MED ORDER — ESMOLOL HCL 100 MG/10ML IV SOLN
INTRAVENOUS | Status: DC | PRN
  Administered 2022-07-21: 30 mg via INTRAVENOUS

## 2022-07-21 MED ORDER — DEXAMETHASONE SODIUM PHOSPHATE 10 MG/ML IJ SOLN
INTRAMUSCULAR | Status: AC
  Filled 2022-07-21: qty 1

## 2022-07-21 MED ORDER — DEXAMETHASONE SODIUM PHOSPHATE 10 MG/ML IJ SOLN
INTRAMUSCULAR | Status: DC | PRN
  Administered 2022-07-21: 10 mg via INTRAVENOUS

## 2022-07-21 MED ORDER — ALBUTEROL SULFATE (2.5 MG/3ML) 0.083% IN NEBU: 3.0000 mL | INHALATION_SOLUTION | RESPIRATORY_TRACT | Status: DC | PRN

## 2022-07-21 MED ORDER — ESMOLOL HCL 100 MG/10ML IV SOLN
INTRAVENOUS | Status: AC
  Filled 2022-07-21: qty 10

## 2022-07-21 MED ORDER — KETAMINE HCL 50 MG/5ML IJ SOSY
PREFILLED_SYRINGE | INTRAMUSCULAR | Status: AC
  Filled 2022-07-21: qty 5

## 2022-07-21 MED ORDER — KETAMINE HCL 10 MG/ML IJ SOLN
INTRAMUSCULAR | Status: DC | PRN
  Administered 2022-07-21 (×2): 25 mg via INTRAVENOUS

## 2022-07-21 MED ORDER — LORAZEPAM 2 MG/ML IJ SOLN
1.0000 mg | Freq: Once | INTRAMUSCULAR | Status: AC
  Administered 2022-07-21: 1 mg via INTRAVENOUS
  Filled 2022-07-21: qty 1

## 2022-07-21 MED ORDER — SUCCINYLCHOLINE CHLORIDE 200 MG/10ML IV SOSY
PREFILLED_SYRINGE | INTRAVENOUS | Status: AC
  Filled 2022-07-21: qty 10

## 2022-07-21 MED ORDER — LIDOCAINE 2% (20 MG/ML) 5 ML SYRINGE
INTRAMUSCULAR | Status: AC
  Filled 2022-07-21: qty 5

## 2022-07-21 SURGICAL SUPPLY — 51 items
ADH SKN CLS APL DERMABOND .7 (GAUZE/BANDAGES/DRESSINGS) ×1
APL PRP STRL LF DISP 70% ISPRP (MISCELLANEOUS) ×1
APPLIER CLIP ROT 10 11.4 M/L (STAPLE)
APR CLP MED LRG 11.4X10 (STAPLE)
BAG COUNTER SPONGE SURGICOUNT (BAG) ×1 IMPLANT
BAG SPEC RTRVL 10 TROC 200 (ENDOMECHANICALS) ×1
BAG SPNG CNTER NS LX DISP (BAG) ×1
BLADE CLIPPER SURG (BLADE) IMPLANT
CHLORAPREP W/TINT 26 (MISCELLANEOUS) ×1 IMPLANT
CLIP APPLIE ROT 10 11.4 M/L (STAPLE) IMPLANT
COVER SURGICAL LIGHT HANDLE (MISCELLANEOUS) ×1 IMPLANT
CUTTER FLEX LINEAR 45M (STAPLE) ×1 IMPLANT
DERMABOND ADVANCED .7 DNX12 (GAUZE/BANDAGES/DRESSINGS) ×1 IMPLANT
ELECT REM PT RETURN 9FT ADLT (ELECTROSURGICAL) ×1
ELECTRODE REM PT RTRN 9FT ADLT (ELECTROSURGICAL) ×1 IMPLANT
GLOVE BIO SURGEON STRL SZ8 (GLOVE) ×1 IMPLANT
GLOVE BIOGEL PI IND STRL 8 (GLOVE) ×1 IMPLANT
GOWN STRL REUS W/ TWL LRG LVL3 (GOWN DISPOSABLE) ×2 IMPLANT
GOWN STRL REUS W/ TWL XL LVL3 (GOWN DISPOSABLE) ×1 IMPLANT
GOWN STRL REUS W/TWL LRG LVL3 (GOWN DISPOSABLE) ×3
GOWN STRL REUS W/TWL XL LVL3 (GOWN DISPOSABLE) ×1
IRRIG SUCT STRYKERFLOW 2 WTIP (MISCELLANEOUS) ×1
IRRIGATION SUCT STRKRFLW 2 WTP (MISCELLANEOUS) ×1 IMPLANT
KIT BASIN OR (CUSTOM PROCEDURE TRAY) ×1 IMPLANT
KIT TURNOVER KIT B (KITS) ×1 IMPLANT
MANIFOLD NEPTUNE II (INSTRUMENTS) ×1 IMPLANT
NDL 22X1.5 STRL (OR ONLY) (MISCELLANEOUS) ×1 IMPLANT
NEEDLE 22X1.5 STRL (OR ONLY) (MISCELLANEOUS) ×1 IMPLANT
NS IRRIG 1000ML POUR BTL (IV SOLUTION) ×1 IMPLANT
PAD ARMBOARD 7.5X6 YLW CONV (MISCELLANEOUS) ×1 IMPLANT
POUCH RETRIEVAL ECOSAC 10 (ENDOMECHANICALS) ×1 IMPLANT
POUCH RETRIEVAL ECOSAC 10MM (ENDOMECHANICALS) ×1
RELOAD 45 VASCULAR/THIN (ENDOMECHANICALS) IMPLANT
RELOAD STAPLE 45 2.5 WHT GRN (ENDOMECHANICALS) IMPLANT
RELOAD STAPLE 45 3.5 BLU ETS (ENDOMECHANICALS) IMPLANT
RELOAD STAPLE TA45 3.5 REG BLU (ENDOMECHANICALS) ×1 IMPLANT
SCISSORS LAP 5X35 DISP (ENDOMECHANICALS) IMPLANT
SET TUBE SMOKE EVAC HIGH FLOW (TUBING) ×1 IMPLANT
SHEARS HARMONIC ACE PLUS 36CM (ENDOMECHANICALS) ×1 IMPLANT
SPECIMEN JAR SMALL (MISCELLANEOUS) ×1 IMPLANT
SUT VIC AB 4-0 PS2 27 (SUTURE) ×1 IMPLANT
TOWEL GREEN STERILE (TOWEL DISPOSABLE) ×1 IMPLANT
TOWEL GREEN STERILE FF (TOWEL DISPOSABLE) ×1 IMPLANT
TRAY FOLEY W/BAG SLVR 16FR (SET/KITS/TRAYS/PACK)
TRAY FOLEY W/BAG SLVR 16FR ST (SET/KITS/TRAYS/PACK) IMPLANT
TRAY LAPAROSCOPIC MC (CUSTOM PROCEDURE TRAY) ×1 IMPLANT
TROCAR BALLN 12MMX100 BLUNT (TROCAR) ×1 IMPLANT
TROCAR Z THREAD OPTICAL 12X100 (TROCAR) ×1 IMPLANT
TROCAR Z-THREAD OPTICAL 5X100M (TROCAR) ×1 IMPLANT
WARMER LAPAROSCOPE (MISCELLANEOUS) ×1 IMPLANT
WATER STERILE IRR 1000ML POUR (IV SOLUTION) ×1 IMPLANT

## 2022-07-21 NOTE — Anesthesia Procedure Notes (Signed)
Procedure Name: Intubation Date/Time: 07/21/2022 10:24 AM  Performed by: Colin Benton, CRNAPre-anesthesia Checklist: Patient identified, Emergency Drugs available, Suction available and Patient being monitored Patient Re-evaluated:Patient Re-evaluated prior to induction Oxygen Delivery Method: Circle system utilized Preoxygenation: Pre-oxygenation with 100% oxygen Induction Type: IV induction and Rapid sequence Laryngoscope Size: Mac and 3 Grade View: Grade I Tube type: Oral Tube size: 7.0 mm Number of attempts: 1 Airway Equipment and Method: Stylet Placement Confirmation: ETT inserted through vocal cords under direct vision, positive ETCO2 and breath sounds checked- equal and bilateral Secured at: 22 cm Tube secured with: Tape Dental Injury: Teeth and Oropharynx as per pre-operative assessment

## 2022-07-21 NOTE — ED Notes (Signed)
ED TO INPATIENT HANDOFF REPORT  ED Nurse Name and Phone #: Gennaro Africa RN  S Name/Age/Gender Terri Ross 32 y.o. female Room/Bed: 045C/045C  Code Status   Code Status: Full Code  Home/SNF/Other Home Patient oriented to: self, place, time, and situation Is this baseline? Yes   Triage Complete: Triage complete  Chief Complaint Acute appendicitis [K35.80]  Triage Note RLQ abdominal pain beginning ~0530 today. Pain worst when palpated.   Seen at [facility] and sent in for appendix study.    Allergies Allergies  Allergen Reactions   Diclofenac Shortness Of Breath    NSAIDS causes asthma attack   Shellfish Allergy Anaphylaxis   Strawberry (Diagnostic) Anaphylaxis   Voltaren [Diclofenac Sodium] Shortness Of Breath   Doxycycline Nausea Only    Gi upset, diarrhea    Fluticasone Other (See Comments)    nosebleeds   Montelukast Other (See Comments)    "Severe emotional reactions"   Lamotrigine Rash and Other (See Comments)    Level of Care/Admitting Diagnosis ED Disposition     ED Disposition  Admit   Condition  --   [facility]: [place] [[id number]]  Level of Care: Med-Surg [16]  May place patient in observation at [facility] or [place] if equivalent level of care is available:: No  Covid Evaluation: Asymptomatic - no recent exposure (last 10 days) testing not required  Diagnosis: Acute appendicitis [id number]  Admitting Physician: Dwan Bolt [id number]  Attending Physician: CCS, MD [[id number]]          B Medical/Surgery History Past Medical History:  Diagnosis Date   Anxiety    Arthritis    Asthma    Dizzy    Panic attacks    Rheumatoid arthritis ([place])    Tachycardia    Urticaria    Past Surgical History:  Procedure Laterality Date   NO PAST SURGERIES       A IV Location/Drains/Wounds Patient Lines/Drains/Airways Status     Active Line/Drains/Airways     Name Placement date Placement time Site Days   Peripheral  IV 07/20/22 20 G Anterior;Left;Proximal Forearm 07/20/22  1944  Forearm  1   Wound / Incision (Open or Dehisced) 08/16/14 Other (Comment) Left;Lateral abrision to LT leg  08/16/14  1255  --  2896            Intake/Output Last 24 hours  Intake/Output Summary (Last 24 hours) at 07/21/2022 0247 Last data filed at 07/21/2022 0212 Gross per 24 hour  Intake 308.3 ml  Output --  Net 308.3 ml    Labs/Imaging Results for orders placed or performed during the hospital encounter of 07/20/22 (from the past 48 hour(s))  Lipase, blood     Status: None   Collection Time: 07/20/22  7:43 PM  Result Value Ref Range   Lipase 28 11 - 51 U/L    Comment: Performed at [facility], [address], [place], [place] [address]  Comprehensive metabolic panel     Status: Abnormal   Collection Time: 07/20/22  7:43 PM  Result Value Ref Range   Sodium 137 135 - 145 mmol/L   Potassium 3.1 (L) 3.5 - 5.1 mmol/L   Chloride 104 98 - 111 mmol/L   CO2 22 22 - 32 mmol/L   Glucose, Bld 94 70 - 99 mg/dL    Comment: Glucose reference range applies only to samples taken after fasting for at least 8 hours.   BUN 6 6 - 20 mg/dL  Creatinine, Ser 0.86 0.44 - 1.00 mg/dL   Calcium 9.1 8.9 - 10.3 mg/dL   Total Protein 8.0 6.5 - 8.1 g/dL   Albumin 4.1 3.5 - 5.0 g/dL   AST 19 15 - 41 U/L   ALT 29 0 - 44 U/L   Alkaline Phosphatase 77 38 - 126 U/L   Total Bilirubin 0.8 0.3 - 1.2 mg/dL   GFR, Estimated >60 >60 mL/min    Comment: (NOTE) Calculated using the CKD-EPI Creatinine Equation (2021)    Anion gap 11 5 - 15    Comment: Performed at [facility] [address], [place], [place] [address]  CBC     Status: Abnormal   Collection Time: 07/20/22  7:43 PM  Result Value Ref Range   WBC 12.6 (H) 4.0 - 10.5 K/uL   RBC 4.96 3.87 - 5.11 MIL/uL   Hemoglobin 14.6 12.0 - 15.0 g/dL   HCT 44.3 36.0 - 46.0 %   MCV 89.3 80.0 - 100.0 fL   MCH 29.4 26.0 - 34.0 pg   MCHC 33.0 30.0 - 36.0 g/dL   RDW 11.9 11.5 - 15.5  %   Platelets 405 (H) 150 - 400 K/uL   nRBC 0.0 0.0 - 0.2 %    Comment: Performed at [facility], [place] [address], [place], [place] [address]  Urinalysis, Routine w reflex microscopic -Urine, Clean Catch     Status: None   Collection Time: 07/20/22  8:04 PM  Result Value Ref Range   Color, Urine YELLOW YELLOW   APPearance CLEAR CLEAR   Specific Gravity, Urine 1.016 1.005 - 1.030   pH 6.0 5.0 - 8.0   Glucose, UA NEGATIVE NEGATIVE mg/dL   Hgb urine dipstick NEGATIVE NEGATIVE   Bilirubin Urine NEGATIVE NEGATIVE   Ketones, ur NEGATIVE NEGATIVE mg/dL   Protein, ur NEGATIVE NEGATIVE mg/dL   Nitrite NEGATIVE NEGATIVE   Leukocytes,Ua NEGATIVE NEGATIVE    Comment: Performed at [facility] [address] [place],  [address]  I-Stat beta hCG blood, ED     Status: None   Collection Time: 07/20/22  8:15 PM  Result Value Ref Range   I-stat hCG, quantitative <5.0 <5 mIU/mL   Comment 3            Comment:   GEST. AGE      CONC.  (mIU/mL)   <=1 WEEK        5 - 50     2 WEEKS       50 - 500     3 WEEKS       100 - 10,000     4 WEEKS     1,000 - 30,000        FEMALE AND NON-PREGNANT FEMALE:     LESS THAN 5 mIU/mL    CT ABDOMEN PELVIS W CONTRAST  Result Date: 07/20/2022 CLINICAL DATA:  Right lower quadrant abdominal pain worse with palpation. EXAM: CT ABDOMEN AND PELVIS WITH CONTRAST TECHNIQUE: Multidetector CT imaging of the abdomen and pelvis was performed using the standard protocol following bolus administration of intravenous contrast. RADIATION DOSE REDUCTION: This exam was performed according to the departmental dose-optimization program which includes automated exposure control, adjustment of the mA and/or kV according to patient size and/or use of iterative reconstruction technique. CONTRAST:  33mL OMNIPAQUE IOHEXOL 350 MG/ML SOLN COMPARISON:  Report from CT abdomen and pelvis 08/16/2014 FINDINGS: Lower chest: No acute abnormality. Hepatobiliary: Diffuse hepatic  steatosis. Gallbladder and  biliary tree are unremarkable. Pancreas: Unremarkable. Spleen: Unremarkable. Adrenals/Urinary Tract: Normal adrenal glands. No urinary calculi or hydronephrosis. Unremarkable bladder. Stomach/Bowel: Normal caliber large and small bowel. Diffuse intramural fat deposition within the colon and terminal ileum can be a normal finding or seen with sequela of chronic inflammation. No colonic wall thickening or pericolonic stranding. The appendix is mildly dilated measuring 9 mm. There is a small amount of periappendiceal free fluid and adjacent stranding. Appendicolith at the base of the appendix. No evidence of perforation or abscess Vascular/Lymphatic: No significant vascular findings are present. No enlarged abdominal or pelvic lymph nodes. Reproductive: Uterus and bilateral adnexa are unremarkable. Tampon in the vagina. Other: No free air.  No abdominal wall hernia. Musculoskeletal: No acute osseous abnormality. IMPRESSION: 1. Findings compatible with early acute appendicitis. No perforation or abscess. 2. Diffuse hepatic steatosis. 3. Diffuse intramural fat deposition within the colon and terminal ileum can be a normal finding or seen with sequela of chronic inflammation. Electronically Signed   By: Placido Sou M.D.   On: 07/20/2022 22:06    Pending Labs Unresulted Labs (From admission, onward)     Start     Ordered   07/28/22 0500  Creatinine, serum  (enoxaparin (LOVENOX)    CrCl >/= 30 ml/min)  Weekly,   R     Comments: while on enoxaparin therapy    07/21/22 0205   07/21/22 0156  HIV Antibody (routine testing w rflx)  (HIV Antibody (Routine testing w reflex) panel)  Once,   R        07/21/22 0205            Vitals/Pain Today's Vitals   07/21/22 0030 07/21/22 0115 07/21/22 0211 07/21/22 0218  BP: 125/86 130/82    Pulse: 98 (!) 104    Resp:      Temp:      TempSrc:      SpO2: 100% 98%    Weight:      Height:      PainSc:   5  7     Isolation  Precautions No active isolations  Medications Medications  fentaNYL (SUBLIMAZE) injection 50 mcg (50 mcg Intravenous Given 07/21/22 0227)  sodium chloride 0.9 % bolus 1,000 mL (0 mLs Intravenous Stopped 07/21/22 0023)    Followed by  0.9 %  sodium chloride infusion (0 mLs Intravenous Stopped 07/21/22 0212)  enoxaparin (LOVENOX) injection 40 mg (has no administration in time range)  lactated ringers infusion ( Intravenous New Bag/Given 07/21/22 0217)  acetaminophen (TYLENOL) tablet 1,000 mg (1,000 mg Oral Given 07/21/22 0227)  HYDROmorphone (DILAUDID) injection 0.5 mg (has no administration in time range)  diphenhydrAMINE (BENADRYL) capsule 25 mg (has no administration in time range)    Or  diphenhydrAMINE (BENADRYL) injection 25 mg (has no administration in time range)  docusate sodium (COLACE) capsule 100 mg (100 mg Oral Given 07/21/22 0226)  ondansetron (ZOFRAN-ODT) disintegrating tablet 4 mg ( Oral See Alternative 07/21/22 0233)    Or  ondansetron (ZOFRAN) injection 4 mg (4 mg Intravenous Given 07/21/22 0233)  albuterol (PROVENTIL) (2.5 MG/3ML) 0.083% nebulizer solution 3 mL (has no administration in time range)  ALPRAZolam (XANAX XR) 24 hr tablet 1 mg (has no administration in time range)  venlafaxine XR (EFFEXOR-XR) 24 hr capsule 150 mg (has no administration in time range)  beclomethasone (QVAR) 80 MCG/ACT inhaler 1 puff (has no administration in time range)  iohexol (OMNIPAQUE) 350 MG/ML injection 75 mL (75 mLs Intravenous Contrast Given 07/20/22 2156)  cefTRIAXone (  ROCEPHIN) 2 g in sodium chloride 0.9 % 100 mL IVPB (0 g Intravenous Stopped 07/21/22 0023)    And  metroNIDAZOLE (FLAGYL) IVPB 500 mg (0 mg Intravenous Stopped 07/21/22 0132)  potassium chloride SA (KLOR-CON M) CR tablet 40 mEq (40 mEq Oral Given 07/21/22 0226)    Mobility walks     Focused Assessments Neuro Assessment Handoff:  Swallow screen pass? Yes          Neuro Assessment: Within Defined Limits Neuro Checks:       Has TPA been given? No If patient is a Neuro Trauma and patient is going to OR before floor call report to [place] nurse: [phone] or [phone]   R Recommendations: See Admitting Provider Note  Report given to:   Additional Notes:

## 2022-07-21 NOTE — Discharge Instructions (Signed)
[facility]  Please arrive at least 30 min before your appointment to complete your check in paperwork.  If you are unable to arrive 30 min prior to your appointment time we may have to cancel or reschedule you. LAPAROSCOPIC SURGERY: POST OP INSTRUCTIONS Always review your discharge instruction sheet given to you by the facility where your surgery was performed. IF YOU HAVE DISABILITY OR FAMILY LEAVE FORMS, YOU MUST BRING THEM TO THE OFFICE FOR PROCESSING.   DO NOT GIVE THEM TO YOUR DOCTOR.  PAIN CONTROL  First take acetaminophen (Tylenol) AND/or ibuprofen (Advil) to control your pain after surgery.  Follow directions on package.  Taking acetaminophen (Tylenol) and/or ibuprofen (Advil) regularly after surgery will help to control your pain and lower the amount of prescription pain medication you may need.  You should not take more than 4,000 mg (4 grams) of acetaminophen (Tylenol) in 24 hours.  You should not take ibuprofen (Advil), aleve, motrin, naprosyn or other NSAIDS if you have a history of stomach ulcers or chronic kidney disease.  A prescription for pain medication may be given to you upon discharge.  Take your pain medication as prescribed, if you still have uncontrolled pain after taking acetaminophen (Tylenol) or ibuprofen (Advil). Use ice packs to help control pain. If you need a refill on your pain medication, please contact your pharmacy.  They will contact our office to request authorization. Prescriptions will not be filled after 5pm or on week-ends.  HOME MEDICATIONS Take your usually prescribed medications unless otherwise directed.  DIET You should follow a light diet the first few days after arrival home.  Be sure to include lots of fluids daily. Avoid fatty, fried foods.   CONSTIPATION It is common to experience some constipation after surgery and if you are taking pain medication.  Increasing fluid intake and taking a stool softener (such as Colace)  will usually help or prevent this problem from occurring.  A mild laxative (Milk of Magnesia or Miralax) should be taken according to package instructions if there are no bowel movements after 48 hours.  WOUND/INCISION CARE Most patients will experience some swelling and bruising in the area of the incisions.  Ice packs will help.  Swelling and bruising can take several days to resolve.  Unless discharge instructions indicate otherwise, follow guidelines below  STERI-STRIPS - you may remove your outer bandages 48 hours after surgery, and you may shower at that time.  You have steri-strips (small skin tapes) in place directly over the incision.  These strips should be left on the skin for 7-10 days.   DERMABOND/SKIN GLUE - you may shower in 24 hours.  The glue will flake off over the next 2-3 weeks. Any sutures or staples will be removed at the office during your follow-up visit.  ACTIVITIES You may resume regular (light) daily activities beginning the next day--such as daily self-care, walking, climbing stairs--gradually increasing activities as tolerated.  You may have sexual intercourse when it is comfortable.  Refrain from any heavy lifting or straining until approved by your doctor. You may drive when you are no longer taking prescription pain medication, you can comfortably wear a seatbelt, and you can safely maneuver your car and apply brakes.  FOLLOW-UP You should see your doctor in the office for a follow-up appointment approximately 2-3 weeks after your surgery.  You should have been given your post-op/follow-up appointment when your surgery was scheduled.  If you did not receive a post-op/follow-up appointment, make sure   that you call for this appointment within a day or two after you arrive home to insure a convenient appointment time.   WHEN TO CALL YOUR DOCTOR: Fever over 101.0 Inability to urinate Continued bleeding from incision. Increased pain, redness, or drainage from the  incision. Increasing abdominal pain  The clinic staff is available to answer your questions during regular business hours.  Please don't hesitate to call and ask to speak to one of the nurses for clinical concerns.  If you have a medical emergency, go to the nearest emergency room or call 911.  A surgeon from [facility] is always on call at the hospital. [address], [address], [place], [place]  [address] ? [address] [address], [place],    [address] [phone] ? [phone] ? FAX [phone]  

## 2022-07-21 NOTE — H&P (Signed)
Terri Ross [birth year]  [id number].    Requesting MD: Dr. Addison Lank Chief Complaint/Reason for Consult: appendicitis  HPI:  Terri Ross is a 32 yo female who presented to the ED with acute abdominal pain. Her pain began yesterday and is focused in the RLQ. She initially thought she pulled a muscle, but the pain worsened. Today she went to urgent care and was sent to the ED. She denies fevers, chills, nausea and vomiting.  She has a history of asthma. She has not had any prior abdominal surgeries.  ROS: Review of Systems  Constitutional:  Negative for chills and fever.  Respiratory:  Negative for shortness of breath.   Gastrointestinal:  Positive for abdominal pain and diarrhea. Negative for nausea and vomiting.  Neurological:  Negative for loss of consciousness.  Psychiatric/Behavioral:         Anxiety    Family History  Problem Relation Age of Onset   Hypertension Father    Anxiety disorder Father    Asthma Sister    Arthritis Sister    Schizophrenia Sister    Cancer Maternal Grandmother    Cancer Paternal Grandmother        lung, POSITIVE TOBACCO    Past Medical History:  Diagnosis Date   Anxiety    Arthritis    Asthma    Dizzy    Panic attacks    Rheumatoid arthritis ([place])    Tachycardia    Urticaria     Past Surgical History:  Procedure Laterality Date   NO PAST SURGERIES      Social History:  reports that she has never smoked. She has never used smokeless tobacco. She reports current alcohol use. She reports that she does not use drugs.  Allergies:  Allergies  Allergen Reactions   Shellfish Allergy Anaphylaxis   Strawberry (Diagnostic) Anaphylaxis   Voltaren [Diclofenac Sodium] Shortness Of Breath   Doxycycline Other (See Comments)    Severe side effects    Diclofenac Other (See Comments)   Fluticasone     Other Reaction(s): issue with eyes   Montelukast Other (See Comments)   Lamotrigine Rash and Other (See Comments)    (Not in a  hospital admission)    Physical Exam: Blood pressure (!) 119/97, pulse (!) 107, temperature 98.4 F (36.9 C), temperature source Oral, resp. rate 18, height 5\' 2"  (1.575 m), weight 86.2 kg, last menstrual period 07/16/2022, SpO2 100 %. General: resting comfortably, appears stated age, no apparent distress Neurological: alert and oriented, no focal deficits, cranial nerves grossly in tact HEENT: normocephalic, atraumatic, oropharynx clear, no scleral icterus CV: regular rate and rhythm Respiratory: normal work of breathing on room air Abdomen: soft, nondistended, focally tender to palpation in the RLQ. No masses or organomegaly. Extremities: warm and well-perfused, no deformities, moving all extremities spontaneously Psychiatric: anxious Skin: warm and dry, no jaundice, no rashes or lesions   Results for orders placed or performed during the hospital encounter of 07/20/22 (from the past 48 hour(s))  Lipase, blood     Status: None   Collection Time: 07/20/22  7:43 PM  Result Value Ref Range   Lipase 28 11 - 51 U/L    Comment: Performed at [facility], [place] [address] [place], [place] [address]  Comprehensive metabolic panel     Status: Abnormal   Collection Time: 07/20/22  7:43 PM  Result Value Ref Range   Sodium 137 135 - 145 mmol/L   Potassium 3.1 (L) 3.5 - 5.1 mmol/L  Chloride 104 98 - 111 mmol/L   CO2 22 22 - 32 mmol/L   Glucose, Bld 94 70 - 99 mg/dL    Comment: Glucose reference range applies only to samples taken after fasting for at least 8 hours.   BUN 6 6 - 20 mg/dL   Creatinine, Ser 0.86 0.44 - 1.00 mg/dL   Calcium 9.1 8.9 - 10.3 mg/dL   Total Protein 8.0 6.5 - 8.1 g/dL   Albumin 4.1 3.5 - 5.0 g/dL   AST 19 15 - 41 U/L   ALT 29 0 - 44 U/L   Alkaline Phosphatase 77 38 - 126 U/L   Total Bilirubin 0.8 0.3 - 1.2 mg/dL   GFR, Estimated >60 >60 mL/min    Comment: (NOTE) Calculated using the CKD-EPI Creatinine Equation (2021)    Anion gap 11 5 - 15    Comment:  Performed at [facility] [address], [place], [place] [address]  CBC     Status: Abnormal   Collection Time: 07/20/22  7:43 PM  Result Value Ref Range   WBC 12.6 (H) 4.0 - 10.5 K/uL   RBC 4.96 3.87 - 5.11 MIL/uL   Hemoglobin 14.6 12.0 - 15.0 g/dL   HCT 44.3 36.0 - 46.0 %   MCV 89.3 80.0 - 100.0 fL   MCH 29.4 26.0 - 34.0 pg   MCHC 33.0 30.0 - 36.0 g/dL   RDW 11.9 11.5 - 15.5 %   Platelets 405 (H) 150 - 400 K/uL   nRBC 0.0 0.0 - 0.2 %    Comment: Performed at [facility], [place] [address] [place], [place] [address]  Urinalysis, Routine w reflex microscopic -Urine, Clean Catch     Status: None   Collection Time: 07/20/22  8:04 PM  Result Value Ref Range   Color, Urine YELLOW YELLOW   APPearance CLEAR CLEAR   Specific Gravity, Urine 1.016 1.005 - 1.030   pH 6.0 5.0 - 8.0   Glucose, UA NEGATIVE NEGATIVE mg/dL   Hgb urine dipstick NEGATIVE NEGATIVE   Bilirubin Urine NEGATIVE NEGATIVE   Ketones, ur NEGATIVE NEGATIVE mg/dL   Protein, ur NEGATIVE NEGATIVE mg/dL   Nitrite NEGATIVE NEGATIVE   Leukocytes,Ua NEGATIVE NEGATIVE    Comment: Performed at [facility] [address], [place],  [address]  I-Stat beta hCG blood, ED     Status: None   Collection Time: 07/20/22  8:15 PM  Result Value Ref Range   I-stat hCG, quantitative <5.0 <5 mIU/mL   Comment 3            Comment:   GEST. AGE      CONC.  (mIU/mL)   <=1 WEEK        5 - 50     2 WEEKS       50 - 500     3 WEEKS       100 - 10,000     4 WEEKS     1,000 - 30,000        FEMALE AND NON-PREGNANT FEMALE:     LESS THAN 5 mIU/mL    CT ABDOMEN PELVIS W CONTRAST  Result Date: 07/20/2022 CLINICAL DATA:  Right lower quadrant abdominal pain worse with palpation. EXAM: CT ABDOMEN AND PELVIS WITH CONTRAST TECHNIQUE: Multidetector CT imaging of the abdomen and pelvis was performed using the standard protocol following bolus administration of intravenous contrast. RADIATION DOSE REDUCTION: This exam was  performed according to the departmental  dose-optimization program which includes automated exposure control, adjustment of the mA and/or kV according to patient size and/or use of iterative reconstruction technique. CONTRAST:  67mL OMNIPAQUE IOHEXOL 350 MG/ML SOLN COMPARISON:  Report from CT abdomen and pelvis 08/16/2014 FINDINGS: Lower chest: No acute abnormality. Hepatobiliary: Diffuse hepatic steatosis. Gallbladder and biliary tree are unremarkable. Pancreas: Unremarkable. Spleen: Unremarkable. Adrenals/Urinary Tract: Normal adrenal glands. No urinary calculi or hydronephrosis. Unremarkable bladder. Stomach/Bowel: Normal caliber large and small bowel. Diffuse intramural fat deposition within the colon and terminal ileum can be a normal finding or seen with sequela of chronic inflammation. No colonic wall thickening or pericolonic stranding. The appendix is mildly dilated measuring 9 mm. There is a small amount of periappendiceal free fluid and adjacent stranding. Appendicolith at the base of the appendix. No evidence of perforation or abscess Vascular/Lymphatic: No significant vascular findings are present. No enlarged abdominal or pelvic lymph nodes. Reproductive: Uterus and bilateral adnexa are unremarkable. Tampon in the vagina. Other: No free air.  No abdominal wall hernia. Musculoskeletal: No acute osseous abnormality. IMPRESSION: 1. Findings compatible with early acute appendicitis. No perforation or abscess. 2. Diffuse hepatic steatosis. 3. Diffuse intramural fat deposition within the colon and terminal ileum can be a normal finding or seen with sequela of chronic inflammation. Electronically Signed   By: Placido Sou M.D.   On: 07/20/2022 22:06      Assessment/Plan 32 yo female presenting with RLQ abdominal pain secondary to acute appendicitis. I personally reviewed her labs, notes and imaging. She has an appendicolith at the base of the appendix, with a dilated fluid filled appendix. I reviewed  the details of a laparoscopic appendectomy, and the benefits and risks including bleeding, infection and damage to surrounding structures. She is very anxious about surgery and asked about alternative treatments. I discussed that we can trial antibiotics alone, but this will have a high risk of failure with the presence of an appendicolith and will likely prolong her hospitalization. She will also be at high risk for recurrent appendicitis. I would strongly favor proceeding with appendectomy, and she agrees to surgery. - NPO, IV fluid hydration - Replete potassium - Abx given in ED (ceftriaxone/flagyl) - Home anxiety medications resumed - VTE: lovenox, SCDs - Dispo: admit to observation, plan for surgery in am   Michaelle Birks, [facility] General, Hepatobiliary and Pancreatic Surgery 07/21/22 12:32 AM

## 2022-07-21 NOTE — Progress Notes (Signed)
Patient ID: Terri Ross, female   DOB: 08-17-1990, 32 y.o.   MRN: [id number]      Subjective: Some RLQ pain, anxious ROS negative except as listed above. Objective: Vital signs in last 24 hours: Temp:  [97.6 F (36.4 C)-98.4 F (36.9 C)] 98.2 F (36.8 C) (03/15 0358) Pulse Rate:  [89-110] 97 (03/15 0740) Resp:  [16-18] 18 (03/15 0740) BP: (119-134)/(82-97) 123/83 (03/15 0358) SpO2:  [97 %-100 %] 97 % (03/15 0740) Weight:  [86.2 kg] 86.2 kg (03/14 1929)    Intake/Output from previous day: 03/14 0701 - 03/15 0700 In: 407.9 [I.V.:308.8; IV Piggyback:99.2] Out: -  Intake/Output this shift: No intake/output data recorded.  General appearance: alert and cooperative Resp: clear to auscultation bilaterally GI: soft, tender RLQ  Lab Results: CBC  Recent Labs    07/20/22 1943  WBC 12.6*  HGB 14.6  HCT 44.3  PLT 405*   BMET Recent Labs    07/20/22 1943  NA 137  K 3.1*  CL 104  CO2 22  GLUCOSE 94  BUN 6  CREATININE 0.86  CALCIUM 9.1   PT/INR No results for input(s): "LABPROT", "INR" in the last 72 hours. ABG No results for input(s): "PHART", "HCO3" in the last 72 hours.  Invalid input(s): "PCO2", "PO2"  Studies/Results: CT ABDOMEN PELVIS W CONTRAST  Result Date: 07/20/2022 CLINICAL DATA:  Right lower quadrant abdominal pain worse with palpation. EXAM: CT ABDOMEN AND PELVIS WITH CONTRAST TECHNIQUE: Multidetector CT imaging of the abdomen and pelvis was performed using the standard protocol following bolus administration of intravenous contrast. RADIATION DOSE REDUCTION: This exam was performed according to the departmental dose-optimization program which includes automated exposure control, adjustment of the mA and/or kV according to patient size and/or use of iterative reconstruction technique. CONTRAST:  25mL OMNIPAQUE IOHEXOL 350 MG/ML SOLN COMPARISON:  Report from CT abdomen and pelvis 08/16/2014 FINDINGS: Lower chest: No acute abnormality. Hepatobiliary: Diffuse  hepatic steatosis. Gallbladder and biliary tree are unremarkable. Pancreas: Unremarkable. Spleen: Unremarkable. Adrenals/Urinary Tract: Normal adrenal glands. No urinary calculi or hydronephrosis. Unremarkable bladder. Stomach/Bowel: Normal caliber large and small bowel. Diffuse intramural fat deposition within the colon and terminal ileum can be a normal finding or seen with sequela of chronic inflammation. No colonic wall thickening or pericolonic stranding. The appendix is mildly dilated measuring 9 mm. There is a small amount of periappendiceal free fluid and adjacent stranding. Appendicolith at the base of the appendix. No evidence of perforation or abscess Vascular/Lymphatic: No significant vascular findings are present. No enlarged abdominal or pelvic lymph nodes. Reproductive: Uterus and bilateral adnexa are unremarkable. Tampon in the vagina. Other: No free air.  No abdominal wall hernia. Musculoskeletal: No acute osseous abnormality. IMPRESSION: 1. Findings compatible with early acute appendicitis. No perforation or abscess. 2. Diffuse hepatic steatosis. 3. Diffuse intramural fat deposition within the colon and terminal ileum can be a normal finding or seen with sequela of chronic inflammation. Electronically Signed   By: Placido Sou M.D.   On: 07/20/2022 22:06    Anti-infectives: Anti-infectives (From admission, onward)    Start     Dose/Rate Route Frequency Ordered Stop   07/20/22 2330  cefTRIAXone (ROCEPHIN) 2 g in sodium chloride 0.9 % 100 mL IVPB       See Hyperspace for full Linked Orders Report.   2 g 200 mL/hr over 30 Minutes Intravenous  Once 07/20/22 2318 07/21/22 0023   07/20/22 2330  metroNIDAZOLE (FLAGYL) IVPB 500 mg       See Hyperspace for  full Linked Orders Report.   500 mg 100 mL/hr over 60 Minutes Intravenous  Once 07/20/22 2318 07/21/22 0132       Assessment/Plan: Acute appendicitis -IV Rocephin and Flagyl, to OR for laparoscopic appendectomy. Procedure, risks, and  benefits discussed and she agrees.  Anxiety - ativan x 1, resume home meds post-op  Asthma - duoneb, getting a treatment now   LOS: 0 days    Georganna Skeans, MD, MPH, FACS Trauma & General Surgery Use [email or web address] to contact on call provider  07/21/2022

## 2022-07-21 NOTE — Transfer of Care (Signed)
Immediate Anesthesia Transfer of Care Note  Patient: Terri Ross  Procedure(s) Performed: APPENDECTOMY LAPAROSCOPIC (Abdomen)  Patient Location: PACU  Anesthesia Type:General  Level of Consciousness: awake and alert   Airway & Oxygen Therapy: Patient Spontanous Breathing and Patient connected to face mask oxygen  Post-op Assessment: Report given to RN, Post -op Vital signs reviewed and stable, and Patient moving all extremities X 4  Post vital signs: Reviewed and stable  Last Vitals:  Vitals Value Taken Time  BP 136/81 07/21/22 1115  Temp    Pulse 124 07/21/22 1118  Resp 23 07/21/22 1118  SpO2 100 % 07/21/22 1118  Vitals shown include unvalidated device data.  Last Pain:  Vitals:   07/21/22 0926  TempSrc:   PainSc: 4       Patients Stated Pain Goal: 0 ([id number])  Complications: No notable events documented.

## 2022-07-21 NOTE — ED Provider Notes (Signed)
[facility] Provider Note  CSN: [id number] [place] date & time: 07/20/22 1853  Chief Complaint(s) Abdominal Pain  HPI Terri Ross is a 32 y.o. female with a past medical history listed below who presents to the emergency department for 1 day of gradually worsening lower abdominal pain mostly in the right lower quadrant.  Worse with palpation, movement and changing positions.  No nausea or vomiting.  No fevers or chills.  No urinary symptoms.  Seen at urgent care earlier today and recommended she present to the emergency department to rule out appendicitis.   Abdominal Pain   Past Medical History Past Medical History:  Diagnosis Date   Anxiety    Arthritis    Asthma    Dizzy    Panic attacks    Rheumatoid arthritis ([place])    Tachycardia    Urticaria    Patient Active Problem List   Diagnosis Date Noted   Atrial tachycardia 08/16/2019   Dizziness 05/05/2019   Rheumatoid arthritis ([place]) 07/28/2016   Chronic tension-type headache, not intractable 07/26/2016   Depression 02/04/2016   GAD (generalized anxiety disorder) 02/04/2016   Mild intermittent asthma 07/16/2013   Home Medication(s) Prior to Admission medications   Medication Sig Start Date End Date Taking? Authorizing Provider  albuterol (PROVENTIL HFA;VENTOLIN HFA) 108 (90 Base) MCG/ACT inhaler INHALE 2 PUFFS EVERY 6 HOURS AS NEEDED FOR WHEEZING OR SHORTNESS OF BREATH 05/15/18   McVey, Gelene Mink, PA-C  ALPRAZolam (XANAX XR) 1 MG 24 hr tablet Take 1 mg by mouth daily. 08/12/19   [provider]  amoxicillin-clavulanate (AUGMENTIN) 875-125 MG tablet Take 1 tablet by mouth 2 (two) times daily. 09/29/19   Maximiano Coss, NP  ergocalciferol (VITAMIN D2) 1.25 MG (50000 UT) capsule Take 1 capsule by mouth once a week. 08/12/19   [provider]  hydrOXYzine (ATARAX/VISTARIL) 50 MG tablet TAKE 1 TO 2 TABLETS BY MOUTH EVERY 8 HOURS AS NEEDED FOR ANXIETY 05/02/18    McVey, Gelene Mink, PA-C  JUNEL FE 1/20 1-20 MG-MCG tablet Take 1 tablet by mouth daily. 07/22/19   [provider]  loratadine (CLARITIN) 10 MG tablet Take 10 mg by mouth daily as needed for allergies.    [provider]  metaxalone (SKELAXIN) 800 MG tablet Take 800 mg by mouth 3 (three) times daily.    [provider]  QVAR REDIHALER 80 MCG/ACT inhaler Inhale 1 puff into the lungs 2 (two) times daily. 08/12/19   [provider]                                                                                                                                    Allergies Voltaren [diclofenac sodium], Doxycycline, Shellfish allergy, Diclofenac, Fluticasone, Montelukast, and Lamotrigine  Review of Systems Review of Systems  Gastrointestinal:  Positive for abdominal pain.   As noted in HPI  Physical Exam Vital Signs  I  have reviewed the triage vital signs BP (!) 119/97 (BP Location: Right Arm)   Pulse (!) 107   Temp 98.4 F (36.9 C) (Oral)   Resp 18   Ht 5\' 2"  (1.575 m)   Wt 86.2 kg   LMP 07/16/2022   SpO2 100%   BMI 34.75 kg/m   Physical Exam Vitals reviewed.  Constitutional:      General: She is not in acute distress.    Appearance: She is well-developed. She is obese. She is not diaphoretic.  HENT:     Head: Normocephalic and atraumatic.     Right Ear: External ear normal.     Left Ear: External ear normal.     Nose: Nose normal.  Eyes:     General: No scleral icterus.    Conjunctiva/sclera: Conjunctivae normal.  Neck:     Trachea: Phonation normal.  Cardiovascular:     Rate and Rhythm: Normal rate and regular rhythm.  Pulmonary:     Effort: Pulmonary effort is normal. No respiratory distress.     Breath sounds: No stridor.  Abdominal:     General: There is no distension.     Tenderness: There is abdominal tenderness in the right lower quadrant. There is guarding and rebound. Positive signs include McBurney's sign.   Musculoskeletal:        General: Normal range of motion.     Cervical back: Normal range of motion.  Neurological:     Mental Status: She is alert and oriented to person, place, and time.  Psychiatric:        Behavior: Behavior normal.     ED Results and Treatments Labs (all labs ordered are listed, but only abnormal results are displayed) Labs Reviewed  COMPREHENSIVE METABOLIC PANEL - Abnormal; Notable for the following components:      Result Value   Potassium 3.1 (*)    All other components within normal limits  CBC - Abnormal; Notable for the following components:   WBC 12.6 (*)    Platelets 405 (*)    All other components within normal limits  LIPASE, BLOOD  URINALYSIS, ROUTINE W REFLEX MICROSCOPIC  I-STAT BETA HCG BLOOD, ED ([facility], [facility], [facility] ONLY)                                                                                                                         EKG  EKG Interpretation  Date/Time:    Ventricular Rate:    PR Interval:    QRS Duration:   QT Interval:    QTC Calculation:   R Axis:     Text Interpretation:         Radiology CT ABDOMEN PELVIS W CONTRAST  Result Date: 07/20/2022 CLINICAL DATA:  Right lower quadrant abdominal pain worse with palpation. EXAM: CT ABDOMEN AND PELVIS WITH CONTRAST TECHNIQUE: Multidetector CT imaging of the abdomen and pelvis was performed using the standard protocol following bolus administration of intravenous contrast. RADIATION DOSE REDUCTION: This exam was  performed according to the departmental dose-optimization program which includes automated exposure control, adjustment of the mA and/or kV according to patient size and/or use of iterative reconstruction technique. CONTRAST:  29mL OMNIPAQUE IOHEXOL 350 MG/ML SOLN COMPARISON:  Report from CT abdomen and pelvis 08/16/2014 FINDINGS: Lower chest: No acute abnormality. Hepatobiliary: Diffuse hepatic steatosis. Gallbladder and biliary tree are unremarkable. Pancreas:  Unremarkable. Spleen: Unremarkable. Adrenals/Urinary Tract: Normal adrenal glands. No urinary calculi or hydronephrosis. Unremarkable bladder. Stomach/Bowel: Normal caliber large and small bowel. Diffuse intramural fat deposition within the colon and terminal ileum can be a normal finding or seen with sequela of chronic inflammation. No colonic wall thickening or pericolonic stranding. The appendix is mildly dilated measuring 9 mm. There is a small amount of periappendiceal free fluid and adjacent stranding. Appendicolith at the base of the appendix. No evidence of perforation or abscess Vascular/Lymphatic: No significant vascular findings are present. No enlarged abdominal or pelvic lymph nodes. Reproductive: Uterus and bilateral adnexa are unremarkable. Tampon in the vagina. Other: No free air.  No abdominal wall hernia. Musculoskeletal: No acute osseous abnormality. IMPRESSION: 1. Findings compatible with early acute appendicitis. No perforation or abscess. 2. Diffuse hepatic steatosis. 3. Diffuse intramural fat deposition within the colon and terminal ileum can be a normal finding or seen with sequela of chronic inflammation. Electronically Signed   By: Placido Sou M.D.   On: 07/20/2022 22:06    Medications Ordered in ED Medications  cefTRIAXone (ROCEPHIN) 2 g in sodium chloride 0.9 % 100 mL IVPB (2 g Intravenous New Bag/Given 07/20/22 2334)    And  metroNIDAZOLE (FLAGYL) IVPB 500 mg (has no administration in time range)  fentaNYL (SUBLIMAZE) injection 50 mcg (50 mcg Intravenous Given 07/20/22 2339)  sodium chloride 0.9 % bolus 1,000 mL (1,000 mLs Intravenous New Bag/Given 07/20/22 2337)    Followed by  0.9 %  sodium chloride infusion (has no administration in time range)  iohexol (OMNIPAQUE) 350 MG/ML injection 75 mL (75 mLs Intravenous Contrast Given 07/20/22 2156)                                                                                                                                      Procedures Procedures  (including critical care time)  Medical Decision Making / ED Course  Click here for ABCD2, HEART and other calculators  Medical Decision Making Amount and/or Complexity of Data Reviewed Labs: ordered. Decision-making details documented in ED Course. Radiology: ordered and independent interpretation performed. Decision-making details documented in ED Course.  Risk Prescription drug management.    Lower abdominal pain. Differential includes but not limited to appendicitis, UTI, pregnancy related process.  Will also assess for biliary obstruction and pancreatitis though feel this is less likely.  CBC with leukocytosis.  No anemia. Metabolic panel without significant electrolyte derangements or renal sufficiency. No evidence of bili obstruction or pancreatitis Pregnancy negative UA without infection  CT scan consistent with uncomplicated  appendicitis.  Patient started on empiric antibiotics, IV fluids and as needed pain meds. Will discuss case with general surgery.  The patient would like to discuss conservative versus surgical management.      Final Clinical Impression(s) / ED Diagnoses Final diagnoses:  Acute appendicitis with localized peritonitis, without perforation, abscess, or gangrene           This chart was dictated using voice recognition software.  Despite best efforts to proofread,  errors can occur which can change the documentation meaning.    Fatima Blank, MD 07/21/22 [phone]

## 2022-07-21 NOTE — Progress Notes (Signed)
Patient returns from surgery at this time

## 2022-07-21 NOTE — Progress Notes (Signed)
Patient to OR at this time

## 2022-07-21 NOTE — Anesthesia Preprocedure Evaluation (Addendum)
Anesthesia Evaluation  Patient identified by MRN, date of birth, ID band Patient awake    Reviewed: Allergy & Precautions, NPO status , Patient's Chart, lab work & pertinent test results  Airway Mallampati: II       Dental no notable dental hx. (+) Poor Dentition, Dental Advisory Given, Missing, Chipped   Pulmonary asthma    Pulmonary exam normal breath sounds clear to auscultation       Cardiovascular negative cardio ROS Normal cardiovascular exam Rhythm:Regular Rate:Normal     Neuro/Psych  Headaches PSYCHIATRIC DISORDERS Anxiety Depression       GI/Hepatic Neg liver ROS,GERD  ,,(+)     (-) substance abuse    Endo/Other  Obesity  Renal/GU negative Renal ROS  negative genitourinary   Musculoskeletal negative musculoskeletal ROS (+)    Abdominal  (+) + obese  Peds  Hematology negative hematology ROS (+)   Anesthesia Other Findings   Reproductive/Obstetrics                             Anesthesia Physical Anesthesia Plan  ASA: 2  Anesthesia Plan: General   Post-op Pain Management: Minimal or no pain anticipated   Induction: Intravenous and Cricoid pressure planned  PONV Risk Score and Plan: 4 or greater and Scopolamine patch - Pre-op, Midazolam, Ondansetron, Treatment may vary due to age or medical condition and Dexamethasone  Airway Management Planned: Oral ETT  Additional Equipment: None  Intra-op Plan:   Post-operative Plan: Extubation in OR  Informed Consent: I have reviewed the patients History and Physical, chart, labs and discussed the procedure including the risks, benefits and alternatives for the proposed anesthesia with the patient or authorized representative who has indicated his/her understanding and acceptance.     Dental advisory given  Plan Discussed with: CRNA and Anesthesiologist  Anesthesia Plan Comments:        Anesthesia Quick Evaluation

## 2022-07-21 NOTE — Op Note (Signed)
  07/20/2022 - 07/21/2022  11:00 AM  PATIENT:  Terri Ross  32 y.o. female  PRE-OPERATIVE DIAGNOSIS:  ACUTE APPENDICITIS  POST-OPERATIVE DIAGNOSIS:  ACUTE APPENDICITIS  PROCEDURE:  Procedure(s): APPENDECTOMY LAPAROSCOPIC  SURGEON:  Surgeon(s): Georganna Skeans, MD  ASSISTANTS: Gaylord Shih, RNFA   ANESTHESIA:   local and general  EBL:  Total I/O In: -  Out: 5 [Blood:5]  BLOOD ADMINISTERED:none  DRAINS: none   SPECIMEN:  Excision  DISPOSITION OF SPECIMEN:  PATHOLOGY  COUNTS:  YES  DICTATION: .Dragon Dictation Procedure in detail: Informed consent was obtained.  She received intravenous antibiotics.  She was brought to the operating room and general endotracheal anesthesia was administered by the anesthesia staff.  Her abdomen was prepped and draped in a sterile fashion.  Timeout procedure was performed.The supraumbilical region was infiltrated with local. Supraumbilical incision was made. Subcutaneous tissues were dissected down revealing the anterior fascia. This was divided sharply along the midline. Peritoneal cavity was entered under direct vision without complication. A 0 Vicryl pursestring was placed around the fascial opening. Hassan trocar was inserted into the abdomen. The abdomen was insufflated with carbon dioxide in standard fashion. Under direct vision a 12 mm left lower quadrant and a 5 mm right mid abdomen port were placed.  Local was used at each port site.  Laparoscopic exploration revealed an inflamed appendix without perforation.  The mesoappendix was divided with the harmonic scalpel achieving excellent hemostasis.  The base of the appendix was then divided with Endo GIA with a vascular load.  The appendix was placed in a bag and removed from the abdomen.  It was sent to pathology.  The right lower quadrant area was copiously irrigated.  Hemostasis was ensured.  The staple line on the cecum was intact.  The irrigation fluid was evacuated.  Four-quadrant  inspection revealed no complications.  Ports were removed under direct vision.  Pneumoperitoneum was released.  The supraumbilical fascia was closed by tying the pursestring.  All 3 wounds were irrigated and the skin of each was closed with 4-0 Vicryl followed by Dermabond.  All counts were correct.  She tolerated the procedure well without apparent complication was taken recovery in stable condition.  PATIENT DISPOSITION:  PACU - hemodynamically stable.   Delay start of Pharmacological VTE agent (>24hrs) due to surgical blood loss or risk of bleeding:  no  Georganna Skeans, MD, MPH, FACS Pager: [phone]  3/15/202411:00 AM

## 2022-07-21 NOTE — Anesthesia Postprocedure Evaluation (Signed)
Anesthesia Post Note  Patient: Terri Ross  Procedure(s) Performed: APPENDECTOMY LAPAROSCOPIC (Abdomen)     Patient location during evaluation: PACU Anesthesia Type: General Level of consciousness: awake and alert and oriented Pain management: pain level controlled Vital Signs Assessment: post-procedure vital signs reviewed and stable Respiratory status: spontaneous breathing, nonlabored ventilation and respiratory function stable Cardiovascular status: blood pressure returned to baseline and stable Postop Assessment: no apparent nausea or vomiting Anesthetic complications: no   No notable events documented.  Last Vitals:  Vitals:   07/21/22 1130 07/21/22 1145  BP: 122/83 121/69  Pulse: (!) 113 (!) 108  Resp: 15 13  Temp:    SpO2: 100% 100%    Last Pain:  Vitals:   07/21/22 1145  TempSrc:   PainSc: Asleep                 Jerzi Tigert A.

## 2022-07-22 ENCOUNTER — Encounter (HOSPITAL_COMMUNITY): Payer: Self-pay | Admitting: General Surgery

## 2022-07-22 MED ORDER — OXYCODONE HCL 5 MG PO TABS: 5.0000 mg | ORAL_TABLET | Freq: Four times a day (QID) | ORAL | 0 refills | Status: AC | PRN

## 2022-07-22 NOTE — Discharge Summary (Signed)
Physician Discharge Summary  Patient ID: Terri Ross MRN: [id number] DOB/AGE: 33-Mar-1992 33 y.o.  Admit date: 07/20/2022 Discharge date: 07/22/2022  Admission Diagnoses:appendicitis  Discharge Diagnoses:  Principal Problem:   Acute appendicitis   Discharged Condition: good  Hospital Course: She underwent laparoscopic appendectomy 3/15. Post-op she is doing well and ready for D/C.  Consults: None  Significant Diagnostic Studies: CT  Treatments: surgery  Discharge Exam: Blood pressure 97/67, pulse 93, temperature 98.5 F (36.9 C), temperature source Oral, resp. rate 17, height 5\' 2"  (1.575 m), weight 86.2 kg, last menstrual period 07/16/2022, SpO2 100 %. General appearance: alert and cooperative Resp: clear to auscultation bilaterally GI: soft, incisions CDI  Disposition: Discharge disposition: 01-Home or Self Care       Discharge Instructions     Call MD for:  redness, tenderness, or signs of infection (pain, swelling, redness, odor or green/yellow discharge around incision site)   Complete by: As directed    Diet - low sodium heart healthy   Complete by: As directed    Discharge instructions   Complete by: As directed    No lifting over 10lbs for 4 weeks You may shower   Increase activity slowly   Complete by: As directed    No dressing needed   Complete by: As directed       Allergies as of 07/22/2022       Reactions   Diclofenac Shortness Of Breath   NSAIDS causes asthma attack   Shellfish Allergy Anaphylaxis   Strawberry (diagnostic) Anaphylaxis   Voltaren [diclofenac Sodium] Shortness Of Breath   Doxycycline Nausea Only   Gi upset, diarrhea   Fluticasone Other (See Comments)   nosebleeds   Montelukast Other (See Comments)   "Severe emotional reactions"   Lamotrigine Rash, Other (See Comments)        Medication List     TAKE these medications    acetaminophen 500 MG tablet Commonly known as: TYLENOL Take 1,000 mg by mouth every 8  (eight) hours.   albuterol 108 (90 Base) MCG/ACT inhaler Commonly known as: VENTOLIN HFA INHALE 2 PUFFS EVERY 6 HOURS AS NEEDED FOR WHEEZING OR SHORTNESS OF BREATH What changed:  how much to take how to take this when to take this reasons to take this additional instructions   ALPRAZolam 1 MG 24 hr tablet Commonly known as: XANAX XR Take 1 mg by mouth at bedtime.   desvenlafaxine 100 MG 24 hr tablet Commonly known as: PRISTIQ Take 100 mg by mouth daily.   EPINEPHrine 0.3 mg/0.3 mL Soaj injection Commonly known as: EPI-PEN Inject 0.3 mg into the muscle as needed for anaphylaxis.   oxyCODONE 5 MG immediate release tablet Commonly known as: Oxy IR/ROXICODONE Take 1 tablet (5 mg total) by mouth every 6 (six) hours as needed for severe pain (5mg  mod pain, 10mg  severe pain).   Qvar RediHaler 80 MCG/ACT inhaler Generic drug: beclomethasone Inhale 1 puff into the lungs 2 (two) times daily.               Discharge Care Instructions  (From admission, onward)           Start     Ordered   07/22/22 0000  No dressing needed        07/22/22 0901            Follow-up Information     Maczis, Carlena Hurl, PA-C Follow up on 08/08/2022.   Specialty: General Surgery Why: 08/08/22 at 8:45 am. Please bring a  copy of your photo ID and insurance card. Please arrive 30 minutes prior to your appointment for paperwork. Contact information: [place] [address] [phone]                 Signed: Zenovia Jarred 07/22/2022, 9:01 AM

## 2022-07-24 LAB — SURGICAL PATHOLOGY

## 2022-09-29 ENCOUNTER — Other Ambulatory Visit (HOSPITAL_BASED_OUTPATIENT_CLINIC_OR_DEPARTMENT_OTHER): Payer: Self-pay

## 2022-09-29 MED ORDER — ALBUTEROL SULFATE HFA 108 (90 BASE) MCG/ACT IN AERS
INHALATION_SPRAY | RESPIRATORY_TRACT | 2 refills | Status: DC
  Filled 2022-09-29 – 2022-10-16 (×2): qty 8.5, 25d supply, fill #0
  Filled 2022-11-24: qty 8.5, 25d supply, fill #1
  Filled 2023-01-09: qty 6.7, 25d supply, fill #2
  Filled 2023-01-28: qty 8.5, fill #3

## 2022-09-29 MED ORDER — QVAR REDIHALER 80 MCG/ACT IN AERB
INHALATION_SPRAY | RESPIRATORY_TRACT | 5 refills | Status: AC
  Filled 2022-09-29: qty 10.6, 60d supply, fill #0
  Filled 2022-10-16: qty 10.6, 30d supply, fill #0
  Filled 2023-01-09: qty 10.6, 30d supply, fill #1
  Filled 2023-02-02 – 2023-02-20 (×2): qty 10.6, 30d supply, fill #2

## 2022-09-29 MED ORDER — SPIRONOLACTONE 25 MG PO TABS
ORAL_TABLET | ORAL | 1 refills | Status: AC
  Filled 2022-09-29: qty 30, 15d supply, fill #0
  Filled 2022-10-28: qty 30, 15d supply, fill #1

## 2022-09-29 MED ORDER — ALPRAZOLAM ER 1 MG PO TB24
ORAL_TABLET | ORAL | 2 refills | Status: AC
  Filled 2022-09-29 – 2022-10-16 (×2): qty 30, 30d supply, fill #0
  Filled 2022-11-24: qty 30, 30d supply, fill #1

## 2022-09-29 MED ORDER — DESVENLAFAXINE SUCCINATE ER 100 MG PO TB24
ORAL_TABLET | ORAL | 4 refills | Status: DC
  Filled 2022-09-29 – 2022-12-16 (×2): qty 90, 90d supply, fill #0
  Filled 2023-03-13 – 2023-03-14 (×5): qty 5, 5d supply, fill #1
  Filled 2023-03-14: qty 30, 30d supply, fill #1
  Filled 2023-03-26: qty 60, 60d supply, fill #2
  Filled 2023-06-15 – 2023-06-16 (×2): qty 60, 60d supply, fill #3
  Filled 2023-08-17: qty 60, 60d supply, fill #4

## 2022-10-03 ENCOUNTER — Other Ambulatory Visit (HOSPITAL_BASED_OUTPATIENT_CLINIC_OR_DEPARTMENT_OTHER): Payer: Self-pay

## 2022-10-13 ENCOUNTER — Other Ambulatory Visit (HOSPITAL_BASED_OUTPATIENT_CLINIC_OR_DEPARTMENT_OTHER): Payer: Self-pay

## 2022-10-16 ENCOUNTER — Other Ambulatory Visit (HOSPITAL_BASED_OUTPATIENT_CLINIC_OR_DEPARTMENT_OTHER): Payer: Self-pay

## 2022-10-27 ENCOUNTER — Other Ambulatory Visit (HOSPITAL_BASED_OUTPATIENT_CLINIC_OR_DEPARTMENT_OTHER): Payer: Self-pay

## 2022-10-30 ENCOUNTER — Other Ambulatory Visit (HOSPITAL_BASED_OUTPATIENT_CLINIC_OR_DEPARTMENT_OTHER): Payer: Self-pay

## 2022-11-24 ENCOUNTER — Other Ambulatory Visit (HOSPITAL_BASED_OUTPATIENT_CLINIC_OR_DEPARTMENT_OTHER): Payer: Self-pay

## 2022-11-24 MED ORDER — ALPRAZOLAM ER 1 MG PO TB24
1.0000 mg | ORAL_TABLET | Freq: Every morning | ORAL | 2 refills | Status: AC
  Filled 2022-11-24 – 2023-01-09 (×2): qty 30, 30d supply, fill #0
  Filled 2023-02-20: qty 30, 30d supply, fill #1

## 2022-11-26 ENCOUNTER — Other Ambulatory Visit (HOSPITAL_BASED_OUTPATIENT_CLINIC_OR_DEPARTMENT_OTHER): Payer: Self-pay

## 2022-11-27 ENCOUNTER — Other Ambulatory Visit (HOSPITAL_BASED_OUTPATIENT_CLINIC_OR_DEPARTMENT_OTHER): Payer: Self-pay

## 2022-12-16 ENCOUNTER — Other Ambulatory Visit (HOSPITAL_BASED_OUTPATIENT_CLINIC_OR_DEPARTMENT_OTHER): Payer: Self-pay

## 2023-01-09 ENCOUNTER — Other Ambulatory Visit: Payer: Self-pay

## 2023-01-09 ENCOUNTER — Other Ambulatory Visit (HOSPITAL_BASED_OUTPATIENT_CLINIC_OR_DEPARTMENT_OTHER): Payer: Self-pay

## 2023-01-10 ENCOUNTER — Other Ambulatory Visit: Payer: Self-pay

## 2023-01-28 ENCOUNTER — Other Ambulatory Visit (HOSPITAL_BASED_OUTPATIENT_CLINIC_OR_DEPARTMENT_OTHER): Payer: Self-pay

## 2023-01-29 ENCOUNTER — Other Ambulatory Visit (HOSPITAL_BASED_OUTPATIENT_CLINIC_OR_DEPARTMENT_OTHER): Payer: Self-pay

## 2023-01-29 MED ORDER — ALBUTEROL SULFATE HFA 108 (90 BASE) MCG/ACT IN AERS
2.0000 | INHALATION_SPRAY | Freq: Four times a day (QID) | RESPIRATORY_TRACT | 2 refills | Status: DC | PRN
  Filled 2023-01-29: qty 6.7, 25d supply, fill #0
  Filled 2023-02-20 – 2023-03-13 (×2): qty 6.7, 25d supply, fill #1
  Filled 2023-04-23: qty 6.7, 25d supply, fill #2

## 2023-02-02 ENCOUNTER — Other Ambulatory Visit (HOSPITAL_BASED_OUTPATIENT_CLINIC_OR_DEPARTMENT_OTHER): Payer: Self-pay

## 2023-02-13 ENCOUNTER — Other Ambulatory Visit (HOSPITAL_BASED_OUTPATIENT_CLINIC_OR_DEPARTMENT_OTHER): Payer: Self-pay

## 2023-02-20 ENCOUNTER — Other Ambulatory Visit (HOSPITAL_BASED_OUTPATIENT_CLINIC_OR_DEPARTMENT_OTHER): Payer: Self-pay

## 2023-02-21 ENCOUNTER — Other Ambulatory Visit: Payer: Self-pay

## 2023-02-23 ENCOUNTER — Other Ambulatory Visit (HOSPITAL_BASED_OUTPATIENT_CLINIC_OR_DEPARTMENT_OTHER): Payer: Self-pay

## 2023-02-23 MED ORDER — ARIPIPRAZOLE 2 MG PO TABS
2.0000 mg | ORAL_TABLET | Freq: Every day | ORAL | 1 refills | Status: DC
  Filled 2023-02-23: qty 30, 30d supply, fill #0
  Filled 2023-03-26: qty 30, 30d supply, fill #1

## 2023-02-23 MED ORDER — TRAZODONE HCL 50 MG PO TABS
50.0000 mg | ORAL_TABLET | Freq: Every day | ORAL | 0 refills | Status: DC
  Filled 2023-02-23: qty 40, 40d supply, fill #0

## 2023-02-23 MED ORDER — ALPRAZOLAM ER 1 MG PO TB24
1.0000 mg | ORAL_TABLET | Freq: Every morning | ORAL | 2 refills | Status: DC
  Filled 2023-03-26: qty 30, 30d supply, fill #0
  Filled 2023-04-30: qty 30, 30d supply, fill #1
  Filled 2023-05-28: qty 30, 30d supply, fill #2

## 2023-02-26 ENCOUNTER — Other Ambulatory Visit (HOSPITAL_BASED_OUTPATIENT_CLINIC_OR_DEPARTMENT_OTHER): Payer: Self-pay

## 2023-02-26 MED ORDER — PANTOPRAZOLE SODIUM 40 MG PO TBEC
40.0000 mg | DELAYED_RELEASE_TABLET | Freq: Every day | ORAL | 1 refills | Status: DC
  Filled 2023-02-26 (×2): qty 30, 30d supply, fill #0
  Filled 2023-03-12 – 2023-03-26 (×3): qty 30, 30d supply, fill #1

## 2023-02-26 MED ORDER — EPINEPHRINE 0.3 MG/0.3ML IJ SOAJ
0.3000 mg | INTRAMUSCULAR | 0 refills | Status: AC | PRN
  Filled 2023-02-26: qty 2, 30d supply, fill #0

## 2023-02-26 MED ORDER — METHOCARBAMOL 500 MG PO TABS
500.0000 mg | ORAL_TABLET | Freq: Three times a day (TID) | ORAL | 0 refills | Status: AC
  Filled 2023-02-26: qty 90, 30d supply, fill #0

## 2023-02-27 ENCOUNTER — Other Ambulatory Visit: Payer: Self-pay

## 2023-02-28 ENCOUNTER — Other Ambulatory Visit (HOSPITAL_BASED_OUTPATIENT_CLINIC_OR_DEPARTMENT_OTHER): Payer: Self-pay

## 2023-02-28 MED ORDER — OPTICHAMBER DIAMOND-LG MASK DEVI
11 refills | Status: AC
  Filled 2023-02-28: qty 1, 1d supply, fill #0

## 2023-02-28 MED ORDER — SUCRALFATE 1 G PO TABS
1.0000 g | ORAL_TABLET | Freq: Four times a day (QID) | ORAL | 0 refills | Status: DC
  Filled 2023-02-28: qty 120, 30d supply, fill #0

## 2023-03-12 ENCOUNTER — Other Ambulatory Visit (HOSPITAL_BASED_OUTPATIENT_CLINIC_OR_DEPARTMENT_OTHER): Payer: Self-pay

## 2023-03-13 ENCOUNTER — Other Ambulatory Visit: Payer: Self-pay

## 2023-03-13 ENCOUNTER — Other Ambulatory Visit (HOSPITAL_BASED_OUTPATIENT_CLINIC_OR_DEPARTMENT_OTHER): Payer: Self-pay

## 2023-03-13 MED ORDER — TRAZODONE HCL 50 MG PO TABS
50.0000 mg | ORAL_TABLET | Freq: Every day | ORAL | 0 refills | Status: AC
  Filled 2023-03-13 – 2023-03-26 (×3): qty 40, 40d supply, fill #0

## 2023-03-14 ENCOUNTER — Other Ambulatory Visit (HOSPITAL_BASED_OUTPATIENT_CLINIC_OR_DEPARTMENT_OTHER): Payer: Self-pay

## 2023-03-14 ENCOUNTER — Other Ambulatory Visit: Payer: Self-pay

## 2023-03-19 ENCOUNTER — Other Ambulatory Visit (HOSPITAL_BASED_OUTPATIENT_CLINIC_OR_DEPARTMENT_OTHER): Payer: Self-pay

## 2023-03-26 ENCOUNTER — Other Ambulatory Visit (HOSPITAL_BASED_OUTPATIENT_CLINIC_OR_DEPARTMENT_OTHER): Payer: Self-pay

## 2023-03-26 ENCOUNTER — Other Ambulatory Visit: Payer: Self-pay

## 2023-03-30 ENCOUNTER — Other Ambulatory Visit (HOSPITAL_BASED_OUTPATIENT_CLINIC_OR_DEPARTMENT_OTHER): Payer: Self-pay

## 2023-03-30 MED ORDER — FLUCONAZOLE 150 MG PO TABS
150.0000 mg | ORAL_TABLET | Freq: Once | ORAL | 0 refills | Status: DC
  Filled 2023-03-30: qty 1, 1d supply, fill #0

## 2023-03-30 MED ORDER — CEFDINIR 300 MG PO CAPS
300.0000 mg | ORAL_CAPSULE | Freq: Two times a day (BID) | ORAL | 0 refills | Status: AC
  Filled 2023-03-30: qty 20, 10d supply, fill #0

## 2023-04-10 ENCOUNTER — Other Ambulatory Visit (HOSPITAL_BASED_OUTPATIENT_CLINIC_OR_DEPARTMENT_OTHER): Payer: Self-pay

## 2023-04-11 ENCOUNTER — Other Ambulatory Visit (HOSPITAL_BASED_OUTPATIENT_CLINIC_OR_DEPARTMENT_OTHER): Payer: Self-pay

## 2023-04-11 MED ORDER — FLUCONAZOLE 150 MG PO TABS
150.0000 mg | ORAL_TABLET | Freq: Once | ORAL | 0 refills | Status: AC
  Filled 2023-04-11: qty 1, 1d supply, fill #0

## 2023-04-13 ENCOUNTER — Other Ambulatory Visit (HOSPITAL_BASED_OUTPATIENT_CLINIC_OR_DEPARTMENT_OTHER): Payer: Self-pay

## 2023-04-13 MED ORDER — METHYLPHENIDATE HCL ER (OSM) 18 MG PO TBCR
18.0000 mg | EXTENDED_RELEASE_TABLET | Freq: Every day | ORAL | 0 refills | Status: DC
  Filled 2023-04-13: qty 30, 30d supply, fill #0

## 2023-04-13 MED ORDER — TRAZODONE HCL 100 MG PO TABS
100.0000 mg | ORAL_TABLET | Freq: Every day | ORAL | 0 refills | Status: AC
  Filled 2023-04-13: qty 90, 90d supply, fill #0

## 2023-04-13 MED ORDER — ARIPIPRAZOLE 2 MG PO TABS
2.0000 mg | ORAL_TABLET | Freq: Every day | ORAL | 1 refills | Status: DC
  Filled 2023-04-13 – 2023-04-23 (×2): qty 30, 30d supply, fill #0
  Filled 2023-06-05 – 2023-06-19 (×2): qty 30, 30d supply, fill #1

## 2023-04-13 MED ORDER — FLUCONAZOLE 150 MG PO TABS
150.0000 mg | ORAL_TABLET | ORAL | 0 refills | Status: DC
  Filled 2023-04-13: qty 3, 9d supply, fill #0

## 2023-04-20 ENCOUNTER — Other Ambulatory Visit (HOSPITAL_BASED_OUTPATIENT_CLINIC_OR_DEPARTMENT_OTHER): Payer: Self-pay

## 2023-04-20 MED ORDER — CEPHALEXIN 500 MG PO CAPS
500.0000 mg | ORAL_CAPSULE | Freq: Two times a day (BID) | ORAL | 0 refills | Status: AC
  Filled 2023-04-20: qty 20, 10d supply, fill #0

## 2023-04-20 MED ORDER — CLINDAMYCIN PHOSPHATE 1 % EX LOTN
1.0000 | TOPICAL_LOTION | Freq: Every day | CUTANEOUS | 1 refills | Status: AC
  Filled 2023-04-20: qty 60, 30d supply, fill #0
  Filled 2023-07-09: qty 60, 30d supply, fill #1

## 2023-04-23 ENCOUNTER — Other Ambulatory Visit (HOSPITAL_BASED_OUTPATIENT_CLINIC_OR_DEPARTMENT_OTHER): Payer: Self-pay

## 2023-04-24 ENCOUNTER — Other Ambulatory Visit (HOSPITAL_BASED_OUTPATIENT_CLINIC_OR_DEPARTMENT_OTHER): Payer: Self-pay

## 2023-04-24 ENCOUNTER — Other Ambulatory Visit: Payer: Self-pay

## 2023-04-24 MED ORDER — PANTOPRAZOLE SODIUM 40 MG PO TBEC
40.0000 mg | DELAYED_RELEASE_TABLET | Freq: Every day | ORAL | 1 refills | Status: DC
  Filled 2023-04-24: qty 30, 30d supply, fill #0
  Filled 2023-05-28: qty 30, 30d supply, fill #1

## 2023-04-30 ENCOUNTER — Other Ambulatory Visit (HOSPITAL_BASED_OUTPATIENT_CLINIC_OR_DEPARTMENT_OTHER): Payer: Self-pay

## 2023-05-28 ENCOUNTER — Other Ambulatory Visit (HOSPITAL_BASED_OUTPATIENT_CLINIC_OR_DEPARTMENT_OTHER): Payer: Self-pay

## 2023-05-29 ENCOUNTER — Other Ambulatory Visit: Payer: Self-pay

## 2023-05-29 ENCOUNTER — Other Ambulatory Visit (HOSPITAL_BASED_OUTPATIENT_CLINIC_OR_DEPARTMENT_OTHER): Payer: Self-pay

## 2023-05-29 MED ORDER — ALBUTEROL SULFATE HFA 108 (90 BASE) MCG/ACT IN AERS
2.0000 | INHALATION_SPRAY | Freq: Four times a day (QID) | RESPIRATORY_TRACT | 2 refills | Status: DC | PRN
  Filled 2023-05-29: qty 6.7, 25d supply, fill #0
  Filled 2023-10-09: qty 6.7, 25d supply, fill #1
  Filled 2023-11-07: qty 6.7, 25d supply, fill #2

## 2023-06-05 ENCOUNTER — Other Ambulatory Visit (HOSPITAL_BASED_OUTPATIENT_CLINIC_OR_DEPARTMENT_OTHER): Payer: Self-pay

## 2023-06-15 ENCOUNTER — Other Ambulatory Visit (HOSPITAL_BASED_OUTPATIENT_CLINIC_OR_DEPARTMENT_OTHER): Payer: Self-pay

## 2023-06-16 ENCOUNTER — Other Ambulatory Visit (HOSPITAL_BASED_OUTPATIENT_CLINIC_OR_DEPARTMENT_OTHER): Payer: Self-pay

## 2023-06-19 ENCOUNTER — Other Ambulatory Visit (HOSPITAL_COMMUNITY): Payer: Self-pay

## 2023-06-19 ENCOUNTER — Other Ambulatory Visit (HOSPITAL_BASED_OUTPATIENT_CLINIC_OR_DEPARTMENT_OTHER): Payer: Self-pay

## 2023-06-20 ENCOUNTER — Other Ambulatory Visit (HOSPITAL_BASED_OUTPATIENT_CLINIC_OR_DEPARTMENT_OTHER): Payer: Self-pay

## 2023-06-25 ENCOUNTER — Other Ambulatory Visit (HOSPITAL_BASED_OUTPATIENT_CLINIC_OR_DEPARTMENT_OTHER): Payer: Self-pay

## 2023-06-25 MED ORDER — BUDESONIDE-FORMOTEROL FUMARATE 160-4.5 MCG/ACT IN AERO
2.0000 | INHALATION_SPRAY | Freq: Two times a day (BID) | RESPIRATORY_TRACT | 0 refills | Status: AC
  Filled 2023-06-25: qty 10.2, 30d supply, fill #0

## 2023-06-25 MED ORDER — PARI BABY CONVERSION KIT MISC: 11 refills | Status: AC

## 2023-06-25 MED ORDER — ALBUTEROL SULFATE (2.5 MG/3ML) 0.083% IN NEBU
3.0000 mL | INHALATION_SOLUTION | RESPIRATORY_TRACT | 1 refills | Status: AC | PRN
  Filled 2023-06-25: qty 375, 30d supply, fill #0

## 2023-06-25 MED ORDER — ALBUTEROL SULFATE HFA 108 (90 BASE) MCG/ACT IN AERS
1.0000 | INHALATION_SPRAY | RESPIRATORY_TRACT | 3 refills | Status: DC | PRN
  Filled 2023-06-25: qty 20.1, 90d supply, fill #0
  Filled 2023-07-01: qty 6.7, 17d supply, fill #0
  Filled 2023-08-02 (×2): qty 6.7, 17d supply, fill #1
  Filled 2023-08-21: qty 6.7, 17d supply, fill #2
  Filled 2023-09-16: qty 6.7, 17d supply, fill #3

## 2023-06-25 MED ORDER — COMPRESSOR NEBULIZER MISC
0 refills | Status: AC
  Filled 2023-06-25: qty 1, 30d supply, fill #0

## 2023-06-25 MED ORDER — PREDNISONE 50 MG PO TABS
50.0000 mg | ORAL_TABLET | Freq: Every day | ORAL | 0 refills | Status: DC
  Filled 2023-06-25: qty 7, 7d supply, fill #0

## 2023-06-26 ENCOUNTER — Other Ambulatory Visit (HOSPITAL_BASED_OUTPATIENT_CLINIC_OR_DEPARTMENT_OTHER): Payer: Self-pay

## 2023-06-28 ENCOUNTER — Other Ambulatory Visit (HOSPITAL_BASED_OUTPATIENT_CLINIC_OR_DEPARTMENT_OTHER): Payer: Self-pay

## 2023-06-28 MED ORDER — PANTOPRAZOLE SODIUM 40 MG PO TBEC
40.0000 mg | DELAYED_RELEASE_TABLET | Freq: Every day | ORAL | 1 refills | Status: AC
  Filled 2023-06-28: qty 30, 30d supply, fill #0
  Filled 2023-08-17: qty 30, 30d supply, fill #1

## 2023-07-01 ENCOUNTER — Other Ambulatory Visit (HOSPITAL_BASED_OUTPATIENT_CLINIC_OR_DEPARTMENT_OTHER): Payer: Self-pay

## 2023-07-02 ENCOUNTER — Other Ambulatory Visit: Payer: Self-pay

## 2023-07-02 ENCOUNTER — Other Ambulatory Visit (HOSPITAL_BASED_OUTPATIENT_CLINIC_OR_DEPARTMENT_OTHER): Payer: Self-pay

## 2023-07-02 MED ORDER — FLUCONAZOLE 150 MG PO TABS
150.0000 mg | ORAL_TABLET | ORAL | 0 refills | Status: AC
  Filled 2023-07-02: qty 3, 9d supply, fill #0

## 2023-07-03 ENCOUNTER — Other Ambulatory Visit (HOSPITAL_BASED_OUTPATIENT_CLINIC_OR_DEPARTMENT_OTHER): Payer: Self-pay

## 2023-07-03 MED ORDER — MULTI PRENATAL 27-0.8 MG PO TABS
1.0000 | ORAL_TABLET | Freq: Every day | ORAL | 3 refills | Status: AC
  Filled 2023-07-03: qty 90, 90d supply, fill #0

## 2023-07-04 ENCOUNTER — Other Ambulatory Visit (HOSPITAL_BASED_OUTPATIENT_CLINIC_OR_DEPARTMENT_OTHER): Payer: Self-pay

## 2023-07-04 ENCOUNTER — Other Ambulatory Visit: Payer: Self-pay

## 2023-07-04 ENCOUNTER — Encounter (HOSPITAL_BASED_OUTPATIENT_CLINIC_OR_DEPARTMENT_OTHER): Payer: Self-pay

## 2023-07-04 MED ORDER — METHYLPHENIDATE HCL ER (OSM) 18 MG PO TBCR
18.0000 mg | EXTENDED_RELEASE_TABLET | Freq: Every morning | ORAL | 0 refills | Status: AC
  Filled 2023-07-04: qty 30, 30d supply, fill #0

## 2023-07-04 MED ORDER — VALACYCLOVIR HCL 1 G PO TABS
2000.0000 mg | ORAL_TABLET | Freq: Two times a day (BID) | ORAL | 1 refills | Status: DC
  Filled 2023-07-04: qty 4, 1d supply, fill #0
  Filled 2023-08-17: qty 4, 1d supply, fill #1

## 2023-07-04 MED ORDER — METHYLPHENIDATE HCL ER (XR) 15 MG PO CP24
15.0000 mg | ORAL_CAPSULE | Freq: Every day | ORAL | 0 refills | Status: DC
  Filled 2023-07-04: qty 30, 30d supply, fill #0

## 2023-07-05 ENCOUNTER — Other Ambulatory Visit (HOSPITAL_BASED_OUTPATIENT_CLINIC_OR_DEPARTMENT_OTHER): Payer: Self-pay

## 2023-07-05 MED ORDER — CIPROFLOXACIN HCL 500 MG PO TABS
500.0000 mg | ORAL_TABLET | Freq: Two times a day (BID) | ORAL | 0 refills | Status: AC
  Filled 2023-07-05: qty 6, 3d supply, fill #0

## 2023-07-06 ENCOUNTER — Encounter (HOSPITAL_BASED_OUTPATIENT_CLINIC_OR_DEPARTMENT_OTHER): Payer: Self-pay

## 2023-07-06 ENCOUNTER — Other Ambulatory Visit (HOSPITAL_BASED_OUTPATIENT_CLINIC_OR_DEPARTMENT_OTHER): Payer: Self-pay

## 2023-07-09 ENCOUNTER — Other Ambulatory Visit (HOSPITAL_BASED_OUTPATIENT_CLINIC_OR_DEPARTMENT_OTHER): Payer: Self-pay

## 2023-07-19 ENCOUNTER — Other Ambulatory Visit (HOSPITAL_BASED_OUTPATIENT_CLINIC_OR_DEPARTMENT_OTHER): Payer: Self-pay

## 2023-07-20 ENCOUNTER — Other Ambulatory Visit (HOSPITAL_BASED_OUTPATIENT_CLINIC_OR_DEPARTMENT_OTHER): Payer: Self-pay

## 2023-07-20 MED ORDER — NITROFURANTOIN MONOHYD MACRO 100 MG PO CAPS
100.0000 mg | ORAL_CAPSULE | Freq: Two times a day (BID) | ORAL | 0 refills | Status: AC
  Filled 2023-07-20: qty 14, 7d supply, fill #0

## 2023-08-02 ENCOUNTER — Other Ambulatory Visit (HOSPITAL_BASED_OUTPATIENT_CLINIC_OR_DEPARTMENT_OTHER): Payer: Self-pay

## 2023-08-03 ENCOUNTER — Other Ambulatory Visit (HOSPITAL_BASED_OUTPATIENT_CLINIC_OR_DEPARTMENT_OTHER): Payer: Self-pay

## 2023-08-17 ENCOUNTER — Other Ambulatory Visit (HOSPITAL_BASED_OUTPATIENT_CLINIC_OR_DEPARTMENT_OTHER): Payer: Self-pay

## 2023-08-17 ENCOUNTER — Other Ambulatory Visit: Payer: Self-pay

## 2023-08-21 ENCOUNTER — Other Ambulatory Visit (HOSPITAL_BASED_OUTPATIENT_CLINIC_OR_DEPARTMENT_OTHER): Payer: Self-pay

## 2023-09-11 ENCOUNTER — Other Ambulatory Visit (HOSPITAL_BASED_OUTPATIENT_CLINIC_OR_DEPARTMENT_OTHER): Payer: Self-pay

## 2023-09-14 ENCOUNTER — Other Ambulatory Visit (HOSPITAL_BASED_OUTPATIENT_CLINIC_OR_DEPARTMENT_OTHER): Payer: Self-pay

## 2023-09-14 MED ORDER — ALPRAZOLAM ER 1 MG PO TB24
1.0000 mg | ORAL_TABLET | Freq: Every morning | ORAL | 2 refills | Status: AC
  Filled 2023-09-14 – 2023-09-15 (×2): qty 30, 30d supply, fill #0
  Filled 2023-11-07: qty 30, 30d supply, fill #1

## 2023-09-14 MED ORDER — ARIPIPRAZOLE 2 MG PO TABS
2.0000 mg | ORAL_TABLET | Freq: Every day | ORAL | 1 refills | Status: AC
  Filled 2023-09-14: qty 30, 30d supply, fill #0

## 2023-09-15 ENCOUNTER — Other Ambulatory Visit (HOSPITAL_BASED_OUTPATIENT_CLINIC_OR_DEPARTMENT_OTHER): Payer: Self-pay

## 2023-09-17 ENCOUNTER — Other Ambulatory Visit (HOSPITAL_BASED_OUTPATIENT_CLINIC_OR_DEPARTMENT_OTHER): Payer: Self-pay

## 2023-09-17 ENCOUNTER — Other Ambulatory Visit: Payer: Self-pay

## 2023-09-28 ENCOUNTER — Other Ambulatory Visit (HOSPITAL_BASED_OUTPATIENT_CLINIC_OR_DEPARTMENT_OTHER): Payer: Self-pay

## 2023-09-28 MED ORDER — BUDESONIDE 1 MG/2ML IN SUSP
1.0000 mg | Freq: Two times a day (BID) | RESPIRATORY_TRACT | 1 refills | Status: AC
Start: 2023-09-28 — End: ?
  Filled 2023-09-28 – 2023-10-09 (×2): qty 120, 30d supply, fill #0

## 2023-09-28 MED ORDER — ONDANSETRON 4 MG PO TBDP
4.0000 mg | ORAL_TABLET | Freq: Three times a day (TID) | ORAL | 0 refills | Status: AC | PRN
  Filled 2023-09-28 – 2023-12-14 (×2): qty 10, 4d supply, fill #0

## 2023-09-28 MED ORDER — AMPHETAMINE-DEXTROAMPHET ER 10 MG PO CP24
10.0000 mg | ORAL_CAPSULE | Freq: Every morning | ORAL | 0 refills | Status: DC
  Filled 2023-09-28 – 2023-10-09 (×2): qty 30, 30d supply, fill #0

## 2023-10-02 ENCOUNTER — Other Ambulatory Visit (HOSPITAL_BASED_OUTPATIENT_CLINIC_OR_DEPARTMENT_OTHER): Payer: Self-pay

## 2023-10-02 ENCOUNTER — Other Ambulatory Visit: Payer: Self-pay

## 2023-10-08 ENCOUNTER — Other Ambulatory Visit (HOSPITAL_BASED_OUTPATIENT_CLINIC_OR_DEPARTMENT_OTHER): Payer: Self-pay

## 2023-10-09 ENCOUNTER — Other Ambulatory Visit (HOSPITAL_BASED_OUTPATIENT_CLINIC_OR_DEPARTMENT_OTHER): Payer: Self-pay

## 2023-10-13 ENCOUNTER — Other Ambulatory Visit (HOSPITAL_BASED_OUTPATIENT_CLINIC_OR_DEPARTMENT_OTHER): Payer: Self-pay

## 2023-10-16 ENCOUNTER — Other Ambulatory Visit (HOSPITAL_BASED_OUTPATIENT_CLINIC_OR_DEPARTMENT_OTHER): Payer: Self-pay

## 2023-10-16 MED ORDER — DESVENLAFAXINE SUCCINATE ER 100 MG PO TB24
100.0000 mg | ORAL_TABLET | Freq: Every day | ORAL | 4 refills | Status: AC
  Filled 2023-10-16: qty 90, 90d supply, fill #0
  Filled 2023-10-17: qty 3, 3d supply, fill #0
  Filled 2023-10-26: qty 30, 30d supply, fill #1
  Filled 2023-11-24: qty 10, 10d supply, fill #2
  Filled 2023-12-07: qty 17, 17d supply, fill #3
  Filled 2023-12-07: qty 13, 13d supply, fill #3

## 2023-10-17 ENCOUNTER — Other Ambulatory Visit (HOSPITAL_BASED_OUTPATIENT_CLINIC_OR_DEPARTMENT_OTHER): Payer: Self-pay

## 2023-10-19 ENCOUNTER — Other Ambulatory Visit (HOSPITAL_BASED_OUTPATIENT_CLINIC_OR_DEPARTMENT_OTHER): Payer: Self-pay

## 2023-10-20 ENCOUNTER — Other Ambulatory Visit (HOSPITAL_BASED_OUTPATIENT_CLINIC_OR_DEPARTMENT_OTHER): Payer: Self-pay

## 2023-10-20 MED ORDER — VALACYCLOVIR HCL 1 G PO TABS
2000.0000 mg | ORAL_TABLET | Freq: Two times a day (BID) | ORAL | 1 refills | Status: DC
  Filled 2023-10-20: qty 4, 1d supply, fill #0
  Filled 2023-12-11 (×2): qty 4, 1d supply, fill #1

## 2023-10-22 ENCOUNTER — Other Ambulatory Visit (HOSPITAL_BASED_OUTPATIENT_CLINIC_OR_DEPARTMENT_OTHER): Payer: Self-pay

## 2023-10-26 ENCOUNTER — Other Ambulatory Visit (HOSPITAL_BASED_OUTPATIENT_CLINIC_OR_DEPARTMENT_OTHER): Payer: Self-pay

## 2023-11-07 ENCOUNTER — Other Ambulatory Visit (HOSPITAL_BASED_OUTPATIENT_CLINIC_OR_DEPARTMENT_OTHER): Payer: Self-pay

## 2023-11-07 ENCOUNTER — Other Ambulatory Visit: Payer: Self-pay

## 2023-11-24 ENCOUNTER — Other Ambulatory Visit (HOSPITAL_BASED_OUTPATIENT_CLINIC_OR_DEPARTMENT_OTHER): Payer: Self-pay

## 2023-11-27 ENCOUNTER — Other Ambulatory Visit (HOSPITAL_BASED_OUTPATIENT_CLINIC_OR_DEPARTMENT_OTHER): Payer: Self-pay

## 2023-11-27 MED ORDER — ALBUTEROL SULFATE HFA 108 (90 BASE) MCG/ACT IN AERS
2.0000 | INHALATION_SPRAY | Freq: Four times a day (QID) | RESPIRATORY_TRACT | 2 refills | Status: AC | PRN
  Filled 2023-11-27: qty 6.7, 25d supply, fill #0
  Filled 2024-04-22: qty 6.7, 25d supply, fill #1
  Filled 2024-05-13: qty 6.7, 25d supply, fill #2

## 2023-11-30 ENCOUNTER — Other Ambulatory Visit (HOSPITAL_BASED_OUTPATIENT_CLINIC_OR_DEPARTMENT_OTHER): Payer: Self-pay

## 2023-11-30 MED ORDER — ALBUTEROL SULFATE HFA 108 (90 BASE) MCG/ACT IN AERS
1.0000 | INHALATION_SPRAY | RESPIRATORY_TRACT | 3 refills | Status: DC | PRN
  Filled 2023-11-30: qty 6.7, 17d supply, fill #0
  Filled 2023-12-24: qty 6.7, 25d supply, fill #0
  Filled 2024-01-16: qty 6.7, 25d supply, fill #1
  Filled 2024-02-19: qty 6.7, 25d supply, fill #2
  Filled 2024-03-21 (×2): qty 6.7, 25d supply, fill #3

## 2023-12-07 ENCOUNTER — Other Ambulatory Visit (HOSPITAL_BASED_OUTPATIENT_CLINIC_OR_DEPARTMENT_OTHER): Payer: Self-pay

## 2023-12-07 MED ORDER — DESVENLAFAXINE SUCCINATE ER 100 MG PO TB24
100.0000 mg | ORAL_TABLET | Freq: Every day | ORAL | 1 refills | Status: AC
  Filled 2023-12-07: qty 30, 30d supply, fill #0
  Filled 2024-01-04: qty 27, 27d supply, fill #0
  Filled 2024-01-04 (×2): qty 3, 3d supply, fill #0

## 2023-12-07 MED ORDER — AMPHETAMINE-DEXTROAMPHET ER 20 MG PO CP24
20.0000 mg | ORAL_CAPSULE | Freq: Every day | ORAL | 0 refills | Status: AC
  Filled 2023-12-07: qty 30, 30d supply, fill #0

## 2023-12-07 MED ORDER — ARIPIPRAZOLE 5 MG PO TABS
5.0000 mg | ORAL_TABLET | Freq: Every day | ORAL | 0 refills | Status: DC
  Filled 2023-12-07: qty 30, 30d supply, fill #0

## 2023-12-07 MED ORDER — TRAZODONE HCL 100 MG PO TABS
100.0000 mg | ORAL_TABLET | Freq: Every day | ORAL | 5 refills | Status: AC
  Filled 2023-12-07: qty 30, 30d supply, fill #0

## 2023-12-07 MED ORDER — AMPHETAMINE-DEXTROAMPHET ER 20 MG PO CP24: 20.0000 mg | ORAL_CAPSULE | Freq: Every day | ORAL | 0 refills | Status: AC

## 2023-12-07 MED ORDER — ALPRAZOLAM ER 1 MG PO TB24
1.0000 mg | ORAL_TABLET | Freq: Every day | ORAL | 5 refills | Status: AC
  Filled 2023-12-07: qty 30, 30d supply, fill #0

## 2023-12-11 ENCOUNTER — Other Ambulatory Visit (HOSPITAL_BASED_OUTPATIENT_CLINIC_OR_DEPARTMENT_OTHER): Payer: Self-pay

## 2023-12-11 ENCOUNTER — Other Ambulatory Visit: Payer: Self-pay

## 2023-12-12 ENCOUNTER — Other Ambulatory Visit (HOSPITAL_BASED_OUTPATIENT_CLINIC_OR_DEPARTMENT_OTHER): Payer: Self-pay

## 2023-12-14 ENCOUNTER — Other Ambulatory Visit (HOSPITAL_BASED_OUTPATIENT_CLINIC_OR_DEPARTMENT_OTHER): Payer: Self-pay

## 2023-12-14 MED ORDER — ONDANSETRON HCL 4 MG PO TABS
4.0000 mg | ORAL_TABLET | Freq: Three times a day (TID) | ORAL | 0 refills | Status: AC | PRN
  Filled 2023-12-14: qty 20, 7d supply, fill #0

## 2023-12-22 ENCOUNTER — Other Ambulatory Visit (HOSPITAL_BASED_OUTPATIENT_CLINIC_OR_DEPARTMENT_OTHER): Payer: Self-pay

## 2023-12-25 ENCOUNTER — Other Ambulatory Visit (HOSPITAL_BASED_OUTPATIENT_CLINIC_OR_DEPARTMENT_OTHER): Payer: Self-pay

## 2023-12-26 ENCOUNTER — Other Ambulatory Visit (HOSPITAL_BASED_OUTPATIENT_CLINIC_OR_DEPARTMENT_OTHER): Payer: Self-pay

## 2023-12-27 ENCOUNTER — Other Ambulatory Visit (HOSPITAL_BASED_OUTPATIENT_CLINIC_OR_DEPARTMENT_OTHER): Payer: Self-pay

## 2023-12-27 MED ORDER — VALACYCLOVIR HCL 1 G PO TABS
2000.0000 mg | ORAL_TABLET | Freq: Two times a day (BID) | ORAL | 1 refills | Status: AC
  Filled 2023-12-27: qty 4, 1d supply, fill #0

## 2024-01-04 ENCOUNTER — Other Ambulatory Visit: Payer: Self-pay

## 2024-01-04 ENCOUNTER — Other Ambulatory Visit (HOSPITAL_BASED_OUTPATIENT_CLINIC_OR_DEPARTMENT_OTHER): Payer: Self-pay

## 2024-01-04 MED ORDER — PROPRANOLOL HCL ER 60 MG PO CP24
60.0000 mg | ORAL_CAPSULE | Freq: Every day | ORAL | 1 refills | Status: DC
  Filled 2024-01-04: qty 30, 30d supply, fill #0

## 2024-01-04 MED ORDER — PENICILLIN V POTASSIUM 500 MG PO TABS
500.0000 mg | ORAL_TABLET | Freq: Three times a day (TID) | ORAL | 0 refills | Status: AC
  Filled 2024-01-04: qty 90, 30d supply, fill #0

## 2024-01-18 ENCOUNTER — Other Ambulatory Visit (HOSPITAL_BASED_OUTPATIENT_CLINIC_OR_DEPARTMENT_OTHER): Payer: Self-pay

## 2024-01-18 MED ORDER — AMPHETAMINE-DEXTROAMPHET ER 30 MG PO CP24: 30.0000 mg | ORAL_CAPSULE | Freq: Every morning | ORAL | 0 refills | Status: DC

## 2024-01-18 MED ORDER — DESVENLAFAXINE SUCCINATE ER 100 MG PO TB24
100.0000 mg | ORAL_TABLET | Freq: Every day | ORAL | 2 refills | Status: AC
  Filled 2024-02-04: qty 30, 30d supply, fill #0
  Filled 2024-02-28: qty 30, 30d supply, fill #1
  Filled 2024-03-25 – 2024-03-29 (×4): qty 30, 30d supply, fill #2

## 2024-01-18 MED ORDER — ALPRAZOLAM ER 1 MG PO TB24
1.0000 mg | ORAL_TABLET | Freq: Every day | ORAL | 4 refills | Status: DC
  Filled 2024-01-18 – 2024-03-25 (×2): qty 30, 30d supply, fill #0
  Filled 2024-04-22 – 2024-04-23 (×2): qty 30, 30d supply, fill #1

## 2024-01-18 MED ORDER — ARIPIPRAZOLE 5 MG PO TABS
5.0000 mg | ORAL_TABLET | Freq: Every day | ORAL | 2 refills | Status: AC
  Filled 2024-03-25: qty 30, 30d supply, fill #0

## 2024-01-18 MED ORDER — AMPHETAMINE-DEXTROAMPHET ER 30 MG PO CP24
30.0000 mg | ORAL_CAPSULE | Freq: Every morning | ORAL | 0 refills | Status: DC
  Filled 2024-03-25: qty 30, 30d supply, fill #0

## 2024-01-21 ENCOUNTER — Other Ambulatory Visit: Payer: Self-pay

## 2024-01-31 ENCOUNTER — Other Ambulatory Visit (HOSPITAL_BASED_OUTPATIENT_CLINIC_OR_DEPARTMENT_OTHER): Payer: Self-pay

## 2024-02-05 ENCOUNTER — Other Ambulatory Visit (HOSPITAL_BASED_OUTPATIENT_CLINIC_OR_DEPARTMENT_OTHER): Payer: Self-pay

## 2024-03-07 ENCOUNTER — Other Ambulatory Visit (HOSPITAL_BASED_OUTPATIENT_CLINIC_OR_DEPARTMENT_OTHER): Payer: Self-pay

## 2024-03-21 ENCOUNTER — Other Ambulatory Visit (HOSPITAL_BASED_OUTPATIENT_CLINIC_OR_DEPARTMENT_OTHER): Payer: Self-pay

## 2024-03-24 ENCOUNTER — Other Ambulatory Visit (HOSPITAL_BASED_OUTPATIENT_CLINIC_OR_DEPARTMENT_OTHER): Payer: Self-pay

## 2024-03-24 MED ORDER — VALACYCLOVIR HCL 1 G PO TABS
2000.0000 mg | ORAL_TABLET | Freq: Two times a day (BID) | ORAL | 0 refills | Status: AC
  Filled 2024-03-24: qty 4, 1d supply, fill #0

## 2024-03-24 MED ORDER — NAPROXEN 500 MG PO TABS
500.0000 mg | ORAL_TABLET | Freq: Two times a day (BID) | ORAL | 1 refills | Status: DC
  Filled 2024-03-24: qty 60, 30d supply, fill #0

## 2024-03-25 ENCOUNTER — Other Ambulatory Visit (HOSPITAL_BASED_OUTPATIENT_CLINIC_OR_DEPARTMENT_OTHER): Payer: Self-pay

## 2024-03-26 ENCOUNTER — Emergency Department (HOSPITAL_BASED_OUTPATIENT_CLINIC_OR_DEPARTMENT_OTHER)
Admission: EM | Admit: 2024-03-26 | Discharge: 2024-03-27 | Disposition: A | Discharge status: Home / Self Care | Attending: Emergency Medicine | Admitting: Emergency Medicine

## 2024-03-26 ENCOUNTER — Other Ambulatory Visit: Payer: Self-pay

## 2024-03-26 ENCOUNTER — Encounter (HOSPITAL_BASED_OUTPATIENT_CLINIC_OR_DEPARTMENT_OTHER): Payer: Self-pay | Admitting: *Deleted

## 2024-03-26 ENCOUNTER — Other Ambulatory Visit (HOSPITAL_BASED_OUTPATIENT_CLINIC_OR_DEPARTMENT_OTHER): Payer: Self-pay

## 2024-03-26 DIAGNOSIS — R112 Nausea with vomiting, unspecified: Secondary | ICD-10-CM | POA: Insufficient documentation

## 2024-03-26 MED ORDER — DIPHENHYDRAMINE HCL 50 MG/ML IJ SOLN
50.0000 mg | Freq: Once | INTRAMUSCULAR | Status: AC
  Administered 2024-03-27: 50 mg via INTRAVENOUS
  Filled 2024-03-26: qty 1

## 2024-03-26 MED ORDER — LACTATED RINGERS IV BOLUS
1000.0000 mL | Freq: Once | INTRAVENOUS | Status: AC
  Administered 2024-03-27: 1000 mL via INTRAVENOUS

## 2024-03-26 MED ORDER — METOCLOPRAMIDE HCL 5 MG/ML IJ SOLN
10.0000 mg | Freq: Once | INTRAMUSCULAR | Status: AC
  Administered 2024-03-27: 10 mg via INTRAVENOUS
  Filled 2024-03-26: qty 2

## 2024-03-26 NOTE — ED Provider Notes (Signed)
 [facility] Provider Note   CSN: [id number] Arrival date & time: 03/26/24  2322     History Chief Complaint  Patient presents with   Vomiting    HPI Terri Ross is a 33 y.o. female presenting for chief complaint of severe vomitting. Ate fast food. Immediately felt ill.  20ish episodes of emesis. NBNB.   Patient's recorded medical, surgical, social, medication list and allergies were reviewed in the Snapshot window as part of the initial history.   Review of Systems   Review of Systems  Constitutional:  Negative for chills and fever.  HENT:  Negative for ear pain and sore throat.   Eyes:  Negative for pain and visual disturbance.  Respiratory:  Negative for cough and shortness of breath.   Cardiovascular:  Negative for chest pain and palpitations.  Gastrointestinal:  Positive for nausea and vomiting. Negative for abdominal pain.  Genitourinary:  Negative for dysuria and hematuria.  Musculoskeletal:  Negative for arthralgias and back pain.  Skin:  Negative for color change and rash.  Neurological:  Negative for seizures and syncope.  All other systems reviewed and are negative.   Physical Exam Updated Vital Signs BP 121/86   Pulse (!) 109   Temp 98 F (36.7 C)   Resp 19   SpO2 100%  Physical Exam Vitals and nursing note reviewed.  Constitutional:      General: She is not in acute distress.    Appearance: She is well-developed.  HENT:     Head: Normocephalic and atraumatic.  Eyes:     Conjunctiva/sclera: Conjunctivae normal.  Cardiovascular:     Rate and Rhythm: Normal rate and regular rhythm.     Heart sounds: No murmur heard. Pulmonary:     Effort: Pulmonary effort is normal. No respiratory distress.     Breath sounds: Normal breath sounds.  Abdominal:     Palpations: Abdomen is soft.     Tenderness: There is no abdominal tenderness.  Musculoskeletal:        General: No swelling.     Cervical back: Neck  supple.  Skin:    General: Skin is warm and dry.     Capillary Refill: Capillary refill takes less than 2 seconds.  Neurological:     Mental Status: She is alert.  Psychiatric:        Mood and Affect: Mood normal.      ED Course/ Medical Decision Making/ A&P    Procedures Procedures   Medications Ordered in ED Medications  lactated ringers  bolus 1,000 mL (0 mLs Intravenous Stopped 03/27/24 0131)  metoCLOPramide (REGLAN) injection 10 mg (10 mg Intravenous Given 03/27/24 0005)  diphenhydrAMINE  (BENADRYL ) injection 50 mg (50 mg Intravenous Given 03/27/24 0005)   Medical Decision Making:   Terri Ross is a 33 y.o. female who presented to the ED today with abdominal pain and nausea/vomitting detailed above.    Patient placed on continuous vitals and telemetry monitoring while in ED which was reviewed periodically.  Complete initial physical exam performed, notably the patient  was with severe, violent vomiting. Otherwise hemodynamically stable. Notably, nonbilious/nonbloody emesis. Reviewed and confirmed nursing documentation for past medical history, family history, social history.    Initial Assessment:   With the patient's presentation of severe vomiting, most likely diagnosis is cyclic vomiting syndrome. Other diagnoses were considered including (but not limited to) appendicitis, cholecystitis, small bowel obstruction, Boerhaave syndrome/Mallory-Weiss tears. These are considered less likely due to history of present illness and physical  exam findings.   This is most consistent with an acute life/limb threatening illness complicated by underlying chronic conditions. This may or may not be complicated by metabolic derangements given severity of the emesis. Most likely causes of patient's cyclic vomiting syndrome include: Diabetic gastroparesis Viral gastroenteritis Foodborne illness  Initial Plan:  Aggressive treatment of nausea vomiting.   Primarily will proceed with IV  fluids and ondansetron . Primary therapy failed, will escalate with IV Reglan and IV Benadryl  Screening labs including CBC and Metabolic panel to evaluate for infectious or metabolic etiology of disease.  Urinalysis with reflex culture ordered to evaluate for UTI or relevant urologic/nephrologic pathology.  Lipase to evaluate for pancreatitis CXR to evaluate for structural/infectious intrathoracic pathology and evaluation for free air in the setting of emesis.  I have considered further evaluation of patient's abdominal pain with cross-sectional imaging. Patient's pain is only present around episodes of nausea and vomiting.  They are not grossly peritonitic and the above severe pathologies are grossly favored less likely. Ultimately, patient's symptoms will be evaluated after medication therapy as above and if intractable symptoms will again reconsider imaging.  If symptoms have resolved, pretest probability would remain very low and below the risks of radiation exposure. EKG to evaluate for cardiac pathology Objective evaluation as below reviewed   Initial Study Results:   Laboratory  All laboratory results reviewed without evidence of clinically relevant pathology.     EKG EKG was reviewed independently. Rate, rhythm, axis, intervals all examined and without medically relevant abnormality. ST segments without concerns for elevations.    Radiology:  All images reviewed independently. Agree with radiology report at this time.   DG Chest Portable 1 View Result Date: 03/27/2024 EXAM: 1 VIEW(S) XRAY OF THE CHEST 03/27/2024 12:13:00 AM COMPARISON: CXR 03/16/2019. CLINICAL HISTORY: Eval for free air. FINDINGS: LUNGS AND PLEURA: No focal pulmonary opacity. No pleural effusion. No pneumothorax. HEART AND MEDIASTINUM: No acute abnormality of the cardiac and mediastinal silhouettes. BONES AND SOFT TISSUES: No acute osseous abnormality. IMPRESSION: 1. No acute cardiopulmonary process. 2. No radiographic  evidence of free air. Electronically signed by: Morgane Naveau MD 03/27/2024 12:21 AM EST RP Workstation: [id number]    Reassessment and Plan:   Patient had complete resolution of their nausea vomiting and have been able to tolerate p.o. liquids at this time.  No metabolic crisis warranting more aggressive intervention at this time.  Will continue treatment with outpatient antiemetics and recommend follow-up with their primary care provider.  Disposition:  I have considered need for hospitalization, however, considering all of the above, I believe this patient is stable for discharge at this time.  Patient/family educated about specific return precautions for given chief complaint and symptoms.  Patient/family educated about follow-up with PCP.     Patient/family expressed understanding of return precautions and need for follow-up. Patient spoken to regarding all imaging and laboratory results and appropriate follow up for these results. All education provided in verbal form with additional information in written form. Time was allowed for answering of patient questions. Patient discharged.    Emergency Department Medication Summary:   Medications  lactated ringers  bolus 1,000 mL (0 mLs Intravenous Stopped 03/27/24 0131)  metoCLOPramide (REGLAN) injection 10 mg (10 mg Intravenous Given 03/27/24 0005)  diphenhydrAMINE  (BENADRYL ) injection 50 mg (50 mg Intravenous Given 03/27/24 0005)          Clinical Impression:  1. Nausea and vomiting, unspecified vomiting type      Discharge   Final Clinical Impression(s) / ED  Diagnoses Final diagnoses:  Nausea and vomiting, unspecified vomiting type    Rx / DC Orders ED Discharge Orders          Ordered    metoCLOPramide (REGLAN) 10 MG tablet  Every 6 hours        03/27/24 0133              Jerral Meth, MD 03/27/24 0134

## 2024-03-26 NOTE — ED Triage Notes (Signed)
 Pt reports that she has been having severe nausea and vomiting since 6pm.  She states that she had hardees at 2 and then began having nausea at 4 and then vomiting since 6pm.  She states that she has been vomiting non stop and has not been able to keep anything down.  She has not had any diarrhea but reports feeling like she might, pt is having abdominal pain.  Pt is having dry heaves in triage. No pain or burning with urination.

## 2024-03-27 ENCOUNTER — Other Ambulatory Visit (HOSPITAL_BASED_OUTPATIENT_CLINIC_OR_DEPARTMENT_OTHER): Payer: Self-pay

## 2024-03-27 ENCOUNTER — Emergency Department (HOSPITAL_BASED_OUTPATIENT_CLINIC_OR_DEPARTMENT_OTHER)

## 2024-03-27 LAB — CBC
HCT: 42.7 % (ref 36.0–46.0)
Hemoglobin: 14.6 g/dL (ref 12.0–15.0)
MCH: 30.2 pg (ref 26.0–34.0)
MCHC: 34.2 g/dL (ref 30.0–36.0)
MCV: 88.2 fL (ref 80.0–100.0)
Platelets: 304 K/uL (ref 150–400)
RBC: 4.84 MIL/uL (ref 3.87–5.11)
RDW: 12.8 % (ref 11.5–15.5)
WBC: 15 K/uL — ABNORMAL HIGH (ref 4.0–10.5)
nRBC: 0 % (ref 0.0–0.2)

## 2024-03-27 LAB — COMPREHENSIVE METABOLIC PANEL WITH GFR
ALT: 42 U/L (ref 0–44)
AST: 27 U/L (ref 15–41)
Albumin: 4.5 g/dL (ref 3.5–5.0)
Alkaline Phosphatase: 98 U/L (ref 38–126)
Anion gap: 17 — ABNORMAL HIGH (ref 5–15)
BUN: 10 mg/dL (ref 6–20)
CO2: 21 mmol/L — ABNORMAL LOW (ref 22–32)
Calcium: 9.5 mg/dL (ref 8.9–10.3)
Chloride: 102 mmol/L (ref 98–111)
Creatinine, Ser: 0.86 mg/dL (ref 0.44–1.00)
GFR, Estimated: >60 mL/min (ref >60)
Glucose, Bld: 123 mg/dL — ABNORMAL HIGH (ref 70–99)
Potassium: 3.8 mmol/L (ref 3.5–5.1)
Sodium: 139 mmol/L (ref 135–145)
Total Bilirubin: 0.9 mg/dL (ref 0.0–1.2)
Total Protein: 7.4 g/dL (ref 6.5–8.1)

## 2024-03-27 LAB — URINALYSIS, ROUTINE W REFLEX MICROSCOPIC
Bilirubin Urine: NEGATIVE
Glucose, UA: NEGATIVE mg/dL
Hgb urine dipstick: NEGATIVE
Leukocytes,Ua: NEGATIVE
Nitrite: NEGATIVE
Specific Gravity, Urine: 1.025 (ref 1.005–1.030)
pH: 8 (ref 5.0–8.0)

## 2024-03-27 LAB — LIPASE, BLOOD: Lipase: 20 U/L (ref 11–51)

## 2024-03-27 LAB — HCG, SERUM, QUALITATIVE: Preg, Serum: NEGATIVE

## 2024-03-27 MED ORDER — METOCLOPRAMIDE HCL 10 MG PO TABS
10.0000 mg | ORAL_TABLET | Freq: Four times a day (QID) | ORAL | 0 refills | Status: AC
  Filled 2024-03-27: qty 30, 8d supply, fill #0

## 2024-04-10 ENCOUNTER — Other Ambulatory Visit (HOSPITAL_BASED_OUTPATIENT_CLINIC_OR_DEPARTMENT_OTHER): Payer: Self-pay

## 2024-04-10 ENCOUNTER — Other Ambulatory Visit: Payer: Self-pay

## 2024-04-10 MED ORDER — VITAMIN D (ERGOCALCIFEROL) 1.25 MG (50000 UNIT) PO CAPS
50000.0000 [IU] | ORAL_CAPSULE | ORAL | 3 refills | Status: AC
  Filled 2024-04-10 – 2024-04-23 (×2): qty 12, 84d supply, fill #0

## 2024-04-21 ENCOUNTER — Other Ambulatory Visit (HOSPITAL_BASED_OUTPATIENT_CLINIC_OR_DEPARTMENT_OTHER): Payer: Self-pay

## 2024-04-22 ENCOUNTER — Other Ambulatory Visit: Payer: Self-pay

## 2024-04-22 ENCOUNTER — Other Ambulatory Visit (HOSPITAL_BASED_OUTPATIENT_CLINIC_OR_DEPARTMENT_OTHER): Payer: Self-pay

## 2024-04-23 ENCOUNTER — Other Ambulatory Visit (HOSPITAL_BASED_OUTPATIENT_CLINIC_OR_DEPARTMENT_OTHER): Payer: Self-pay

## 2024-04-24 ENCOUNTER — Other Ambulatory Visit (HOSPITAL_BASED_OUTPATIENT_CLINIC_OR_DEPARTMENT_OTHER): Payer: Self-pay

## 2024-04-24 MED ORDER — DESVENLAFAXINE SUCCINATE ER 100 MG PO TB24
100.0000 mg | ORAL_TABLET | Freq: Every day | ORAL | 2 refills | Status: AC
  Filled 2024-04-24 – 2024-05-05 (×2): qty 30, 30d supply, fill #0

## 2024-04-24 MED ORDER — AMPHETAMINE-DEXTROAMPHETAMINE 10 MG PO TABS
10.0000 mg | ORAL_TABLET | Freq: Two times a day (BID) | ORAL | 0 refills | Status: AC
  Filled 2024-04-24: qty 60, 30d supply, fill #0

## 2024-04-24 MED ORDER — CLONAZEPAM 1 MG PO TABS
1.0000 mg | ORAL_TABLET | Freq: Every day | ORAL | 2 refills | Status: DC
  Filled 2024-04-24: qty 60, 30d supply, fill #0

## 2024-04-24 MED ORDER — TRAZODONE HCL 50 MG PO TABS
50.0000 mg | ORAL_TABLET | Freq: Every evening | ORAL | 2 refills | Status: AC | PRN
  Filled 2024-04-24: qty 30, 30d supply, fill #0

## 2024-04-24 MED ORDER — AMPHETAMINE-DEXTROAMPHETAMINE 10 MG PO TABS: 10.0000 mg | ORAL_TABLET | Freq: Two times a day (BID) | ORAL | 0 refills | Status: AC

## 2024-04-24 MED ORDER — ARIPIPRAZOLE 5 MG PO TABS
5.0000 mg | ORAL_TABLET | Freq: Every day | ORAL | 2 refills | Status: AC
  Filled 2024-04-24: qty 90, 90d supply, fill #0

## 2024-04-25 ENCOUNTER — Other Ambulatory Visit (HOSPITAL_BASED_OUTPATIENT_CLINIC_OR_DEPARTMENT_OTHER): Payer: Self-pay

## 2024-04-25 MED ORDER — CELECOXIB 200 MG PO CAPS
200.0000 mg | ORAL_CAPSULE | Freq: Every day | ORAL | 0 refills | Status: DC
  Filled 2024-04-25: qty 30, 30d supply, fill #0

## 2024-05-06 ENCOUNTER — Other Ambulatory Visit (HOSPITAL_BASED_OUTPATIENT_CLINIC_OR_DEPARTMENT_OTHER): Payer: Self-pay

## 2024-05-13 ENCOUNTER — Other Ambulatory Visit: Payer: Self-pay

## 2024-05-13 ENCOUNTER — Other Ambulatory Visit (HOSPITAL_BASED_OUTPATIENT_CLINIC_OR_DEPARTMENT_OTHER): Payer: Self-pay

## 2024-05-13 MED ORDER — PSEUDOEPHEDRINE-GUAIFENESIN ER 120-1200 MG PO TB12
1.0000 | ORAL_TABLET | Freq: Two times a day (BID) | ORAL | 1 refills | Status: AC
  Filled 2024-05-13: qty 24, 12d supply, fill #0

## 2024-05-13 MED ORDER — OSELTAMIVIR PHOSPHATE 75 MG PO CAPS
ORAL_CAPSULE | ORAL | 0 refills | Status: AC
  Filled 2024-05-13: qty 10, 5d supply, fill #0

## 2024-05-14 ENCOUNTER — Other Ambulatory Visit (HOSPITAL_BASED_OUTPATIENT_CLINIC_OR_DEPARTMENT_OTHER): Payer: Self-pay

## 2024-05-14 MED ORDER — ALBUTEROL SULFATE HFA 108 (90 BASE) MCG/ACT IN AERS
1.0000 | INHALATION_SPRAY | RESPIRATORY_TRACT | 3 refills | Status: AC | PRN
  Filled 2024-05-14 – 2024-06-05 (×3): qty 6.7, 25d supply, fill #0

## 2024-05-28 ENCOUNTER — Other Ambulatory Visit: Payer: Self-pay

## 2024-05-28 ENCOUNTER — Other Ambulatory Visit (HOSPITAL_BASED_OUTPATIENT_CLINIC_OR_DEPARTMENT_OTHER): Payer: Self-pay

## 2024-05-28 MED ORDER — AMPHETAMINE-DEXTROAMPHETAMINE 10 MG PO TABS: 10.0000 mg | ORAL_TABLET | Freq: Two times a day (BID) | ORAL | 0 refills | Status: AC

## 2024-05-28 MED ORDER — DESVENLAFAXINE SUCCINATE ER 100 MG PO TB24
100.0000 mg | ORAL_TABLET | Freq: Every day | ORAL | 0 refills | Status: AC
  Filled 2024-05-28: qty 90, 90d supply, fill #0
  Filled 2024-05-31: qty 30, 30d supply, fill #0
  Filled 2024-05-31: qty 90, 90d supply, fill #0

## 2024-05-28 MED ORDER — TRAZODONE HCL 50 MG PO TABS
50.0000 mg | ORAL_TABLET | Freq: Every evening | ORAL | 2 refills | Status: AC | PRN
  Filled 2024-05-28: qty 30, 30d supply, fill #0

## 2024-05-28 MED ORDER — ARIPIPRAZOLE 5 MG PO TABS
5.0000 mg | ORAL_TABLET | Freq: Every day | ORAL | 0 refills | Status: AC
  Filled 2024-05-28: qty 90, 90d supply, fill #0

## 2024-05-28 MED ORDER — ALPRAZOLAM ER 1 MG PO TB24
1.0000 mg | ORAL_TABLET | Freq: Every day | ORAL | 2 refills | Status: AC
  Filled 2024-05-28: qty 30, 30d supply, fill #0

## 2024-05-31 ENCOUNTER — Other Ambulatory Visit (HOSPITAL_BASED_OUTPATIENT_CLINIC_OR_DEPARTMENT_OTHER): Payer: Self-pay

## 2024-05-31 ENCOUNTER — Other Ambulatory Visit (HOSPITAL_COMMUNITY): Payer: Self-pay

## 2024-06-04 ENCOUNTER — Other Ambulatory Visit (HOSPITAL_BASED_OUTPATIENT_CLINIC_OR_DEPARTMENT_OTHER): Payer: Self-pay

## 2024-06-05 ENCOUNTER — Other Ambulatory Visit (HOSPITAL_BASED_OUTPATIENT_CLINIC_OR_DEPARTMENT_OTHER): Payer: Self-pay

## 2024-06-06 ENCOUNTER — Other Ambulatory Visit (HOSPITAL_BASED_OUTPATIENT_CLINIC_OR_DEPARTMENT_OTHER): Payer: Self-pay
